# Patient Record
Sex: Male | Born: 1990 | Race: White | Hispanic: No | Marital: Single | Smoking: Former smoker
Health system: Southern US, Community
[De-identification: ages and names within clinical notes are randomized; demographics above are authoritative.]

## PROBLEM LIST (undated history)

## (undated) DIAGNOSIS — K219 Gastro-esophageal reflux disease without esophagitis: Secondary | ICD-10-CM

## (undated) DIAGNOSIS — J342 Deviated nasal septum: Secondary | ICD-10-CM

## (undated) DIAGNOSIS — K200 Eosinophilic esophagitis: Secondary | ICD-10-CM

## (undated) DIAGNOSIS — F419 Anxiety disorder, unspecified: Secondary | ICD-10-CM

## (undated) DIAGNOSIS — J302 Other seasonal allergic rhinitis: Secondary | ICD-10-CM

## (undated) HISTORY — DX: Other seasonal allergic rhinitis: J30.2

## (undated) HISTORY — DX: Anxiety disorder, unspecified: F41.9

## (undated) HISTORY — PX: TYMPANOSTOMY TUBE PLACEMENT: SHX32

---
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
## 2010-01-02 ENCOUNTER — Ambulatory Visit (HOSPITAL_COMMUNITY): Payer: Self-pay | Admitting: Psychiatry

## 2010-01-14 ENCOUNTER — Ambulatory Visit (HOSPITAL_COMMUNITY): Payer: Self-pay | Admitting: Marriage and Family Therapist

## 2010-01-23 ENCOUNTER — Ambulatory Visit (HOSPITAL_COMMUNITY): Payer: Self-pay | Admitting: Marriage and Family Therapist

## 2010-01-27 ENCOUNTER — Ambulatory Visit (HOSPITAL_COMMUNITY): Payer: Self-pay | Admitting: Psychiatry

## 2010-02-06 ENCOUNTER — Ambulatory Visit (HOSPITAL_COMMUNITY): Payer: Self-pay | Admitting: Marriage and Family Therapist

## 2010-02-11 ENCOUNTER — Ambulatory Visit (HOSPITAL_COMMUNITY): Payer: Self-pay | Admitting: Marriage and Family Therapist

## 2010-02-20 ENCOUNTER — Ambulatory Visit (HOSPITAL_COMMUNITY): Payer: Self-pay | Admitting: Marriage and Family Therapist

## 2010-02-25 ENCOUNTER — Ambulatory Visit (HOSPITAL_COMMUNITY): Payer: Self-pay | Admitting: Psychiatry

## 2010-03-06 ENCOUNTER — Ambulatory Visit (HOSPITAL_COMMUNITY): Payer: Self-pay | Admitting: Marriage and Family Therapist

## 2010-03-20 ENCOUNTER — Ambulatory Visit (HOSPITAL_COMMUNITY): Payer: Self-pay | Admitting: Marriage and Family Therapist

## 2010-04-09 ENCOUNTER — Ambulatory Visit (HOSPITAL_COMMUNITY): Payer: Self-pay | Admitting: Marriage and Family Therapist

## 2010-04-29 ENCOUNTER — Ambulatory Visit (HOSPITAL_COMMUNITY): Payer: Self-pay | Admitting: Marriage and Family Therapist

## 2010-05-20 ENCOUNTER — Ambulatory Visit (HOSPITAL_COMMUNITY): Payer: Self-pay | Admitting: Marriage and Family Therapist

## 2010-07-14 ENCOUNTER — Ambulatory Visit (HOSPITAL_COMMUNITY): Payer: Self-pay | Admitting: Marriage and Family Therapist

## 2010-08-27 ENCOUNTER — Ambulatory Visit (HOSPITAL_COMMUNITY): Payer: Self-pay | Admitting: Marriage and Family Therapist

## 2011-08-08 ENCOUNTER — Emergency Department (HOSPITAL_COMMUNITY)
Admission: EM | Admit: 2011-08-08 | Discharge: 2011-08-08 | Disposition: A | Discharge status: Home / Self Care | Payer: BC Managed Care – PPO | Attending: Emergency Medicine | Admitting: Emergency Medicine

## 2011-08-08 DIAGNOSIS — S61209A Unspecified open wound of unspecified finger without damage to nail, initial encounter: Secondary | ICD-10-CM | POA: Insufficient documentation

## 2011-08-08 DIAGNOSIS — W268XXA Contact with other sharp object(s), not elsewhere classified, initial encounter: Secondary | ICD-10-CM | POA: Insufficient documentation

## 2011-08-08 DIAGNOSIS — Y92009 Unspecified place in unspecified non-institutional (private) residence as the place of occurrence of the external cause: Secondary | ICD-10-CM | POA: Insufficient documentation

## 2011-12-07 ENCOUNTER — Ambulatory Visit (INDEPENDENT_AMBULATORY_CARE_PROVIDER_SITE_OTHER): Payer: BC Managed Care – PPO | Admitting: Psychiatry

## 2011-12-07 ENCOUNTER — Encounter (HOSPITAL_COMMUNITY): Payer: Self-pay | Admitting: Psychiatry

## 2011-12-07 DIAGNOSIS — F411 Generalized anxiety disorder: Secondary | ICD-10-CM

## 2011-12-07 MED ORDER — ARIPIPRAZOLE 5 MG PO TABS: ORAL_TABLET | ORAL | Status: DC

## 2011-12-07 NOTE — Patient Instructions (Signed)
Asperger Syndrome Asperger syndrome (AS) is one of the autistic spectrum disorders. Children with AS have good language skills and want to interact socially but are not very good at it. Like autistic children, children with AS have restricted and repetitive behavior.  CAUSES  There are many different ways to get AS, including:  Lack of oxygen.   Head injury from trauma.   Hormonal imbalances, such as low thyroid function.   Toxins, such as lead.   Genetic and biochemical disorders of the body.  SYMPTOMS  The most common symptoms of AS are:  Poor social skills. Children with AS do not understand what is and what is not appropriate when talking to other children.   Lack of understanding of personal space.   Lack of understanding of other people's point of view.   Obsessive interest in one topic or object (restricted behavior) and desire to know everything about their area of interest.   Constant discussion of one subject.   Repetitive behavior.   Hand flapping.   Rocking back and forth.   More complex motor movements.  Other symptoms may include:  Self abusive behavior, such as head banging or skin scratching.   Reduced pain sensitivity.   Over sensitivity to sensations, such as sound or touch.   Dislike of being touched or hugged.  DIAGNOSIS  The diagnosis of AS is based on clinical symptoms. Formal testing may be done to confirm the diagnosis. Once AS is diagnosed, a caregiver may order the following tests to find out the cause:  Thyroid studies (if not done at birth).   Lead level tests.   Genetic and biochemical tests.   A test that produces a record of brainwaves (electroencephalogram, EEG) may be done if seizures have occurred.   Neuroimaging studies may be done if there is injury to the brain or a loss of developmental skills.   Ophthalmologic evaluation regarding biochemical or genetic disorders.  TREATMENT  Because there are so many different causes of  AS, there is no single treatment that will work for every child. Treatment varies but is usually a combination of:   Social skills training. This teaches children to interact better with others, especially other children. Parents can set an example of good behavior for their children and teach them how to recognize social cues.   Behavioral therapy. This is for children with behavioral or emotional problems. This therapy can also help with obsessive interests or repetitive routines.   Medications. Medications may be used to treat attention deficit hyperactivity disorder, mood swings, obsessive-compulsive behavior, and oppositional defiant disorder.   Physical therapy helps with poor coordination of the large muscles. Parents can also help by enrolling their children in physical activities such as gymnastics or martial arts in which the child can progress at their own pace without the peer pressures found in team sports.   Occupational therapy helps with poor coordination of smaller muscles, such as muscles in the hand. Occupational therapy can also help with sensorimotor dyspraxias in which certain sounds or textures may be bothersome to the child.   Speech therapy helps children who have trouble with their speech or conversations.   Parent training and support teaches parents how to manage behavioral issues. Older children and teenagers may become sad when they realize they are different because of their AS. Parents should be prepared to empathize with their children when this occurs. Support groups can be helpful.  With continued and effective treatment, most children with AS have fewer   symptoms, and the children and families learn to cope. Although many children with AS go on to be successful and happy adults, social and personal relationships can continue to be challenging. SEEK MEDICAL CARE IF:   Your child has new behaviors that are worrying you.   You think your child is having reactions  to medicines.   Your older child is depressed. Symptoms may include unusual sadness, decreased appetite, weight loss, lack of interest in things that are normally enjoyed, change in sleep patterns.   Your older child shows signs of anxiety. Symptoms may include excessive worry, restlessness, irritability, trembling, difficultly sleeping.  SEEK IMMEDIATE MEDICAL CARE IF:   Your child talks about suicide or death.   Your child gives away his or her favorite possessions (a common sign that one is thinking about suicide).   Your child is suddenly cheerful or happy after having been sad for a long time (a sign that a decision has been made to attempt suicide).  Document Released: 08/08/2002 Document Revised: 07/29/2011 Document Reviewed: 03/20/2010 [employer] Patient Information 2012 [employer]. 

## 2011-12-07 NOTE — Progress Notes (Signed)
Psychiatric Assessment Adult  Patient Identification:  Terry Moreno Date of Evaluation:  12/07/2011 Chief Complaint: I am having intrusive thoughts, I have had a lot of anxiety History of Chief Complaint:   Chief Complaint  Patient presents with  . Depression  . Panic Attack  . Establish Care    HPI patient is a 21 year old male with a history of anxiety disorder who returns back to the practice secondary to complaints of increased anxiety, some depressive symptoms along with intrusive thoughts which are violent in nature. Patient adds that he is no history of violence, knows he will not act on those thoughts but is still worried about having them.  On being asked to elaborate the patient reports that he's had these thoughts for the past 2-3 weeks and adds that when he needs someone he is thoughts of hurting them, has violent sexual thoughts and that these thoughts frighten him. He denies having access to any firearms, having thoughts of hurting anyone specifically. He says that he is a lot of social support which include his parents, friends and adds that he is doing well at college. He currently denies any stressors which might have precipitated these thoughts. He adds that he's not in a relationship. He however is no longer struggling socially as he was 1-1/2 years ago. He currently lives in an apartment with roommates in [place] and does not have any thoughts of hurting his roommates.  The patient says that because of having such thoughts, he's unable to sleep as he scared that he might act on these at some point. He adds that it makes him depressed as he does not want to have these thoughts. He denies any symptoms of mania. He also denies any psychotic symptoms Review of SystemsNegative Physical Exam  Depressive Symptoms: insomnia, anxiety and decreased appetite  (Hypo) Manic Symptoms:   Elevated Mood:  No Irritable Mood:  No Grandiosity:  No Distractibility:  No Labiality of Mood:   No Delusions:  No Hallucinations:  No Impulsivity:  No Sexually Inappropriate Behavior:  No Financial Extravagance:  No Flight of Ideas:  No  Anxiety Symptoms: Excessive Worry:  Yes Panic Symptoms:  No Agoraphobia:  No Obsessive Compulsive: Yes  Symptoms: checking locks, checking if work turned in Specific Phobias:  No Social Anxiety:  No  Psychotic Symptoms:  Hallucinations: No None Delusions:  No Paranoia:  No   Ideas of Reference:  No  PTSD Symptoms: Ever had a traumatic exposure:  No Had a traumatic exposure in the last month:  No Re-experiencing: No None Hypervigilance:  No Hyperarousal: No None Avoidance: No None  Traumatic Brain Injury: No   Past Psychiatric History: Diagnosis: Anxiety disorder   Hospitalizations: None   Outpatient Care: With seen at outpatient [facility] for medication management and counseling   Substance Abuse Care: None   Self-Mutilation: None   Suicidal Attempts: None   Violent Behaviors: None    Past Medical History:   Past Medical History  Diagnosis Date  . Anxiety   . Seasonal allergies    History of Loss of Consciousness:  No Seizure History:  No Cardiac History:  No Allergies:  No Known Allergies Current Medications:  No current outpatient prescriptions on file.    Previous Psychotropic Medications:  Medication Dose  Celexa    Effexor                   Substance Abuse History in the last 12 months: Substance Age of 1st Use Last  Use Amount Specific Type        Alcohol  15  a week ago  once a mth  4-5 beers, couple of shots  Cannabis  19 Last week  twice a wk                                                                              Medical Consequences of Substance Abuse: none  Legal Consequences of Substance Abuse: none  Family Consequences of Substance Abuse: none  Blackouts:  No DT's:  No Withdrawal Symptoms:  No None  Social History: Current Place of Residence: Living in an off campus  apartment for the past 1 1/2 years Place of Birth: [place] Family Members: parents & 22 yr old sister Marital Status:  Single Children: none   Relationships: none  Education:  Automotive engineer, 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 Anxiety disorder Mother   . Anxiety disorder Father   . Depression Father     Mental Status Examination/Evaluation: Objective:  Appearance: Fairly Groomed  Patent attorney::  Fair  Speech:  Clear and Coherent  Volume:  Normal  Mood:  Anxious   Affect:  Congruent  Thought Process:  Goal Directed  Orientation:  Full  Thought Content:  Hallucinations: None  Suicidal Thoughts:  No  Homicidal Thoughts:  No  Judgement:  Intact  Insight:  Fair  Psychomotor Activity:  Normal  Akathisia:  No  Handed:  Right  AIMS (if indicated):  N/A  Assets:  Desire for Improvement Housing Physical Health Social Support           Assessment:  Axis I: Generalized Anxiety Disorder  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 Anxiety   . Seasonal allergies      AXIS IV problems related to social environment  AXIS V 60   Treatment Plan/Recommendations:  Plan of Care: To start Abilify 5 mg half a pill in the evening for 2 days and then increase to one pill for 2 weeks and then 2 pills Qdaily  Laboratory:  none  Psychotherapy: Pt not willing for therapy    Routine PRN Medications:  No  Consultations: None  Safety Concerns:  Discussed going to the ER if the thoughts were so intrusive that the patient felt that he would act on them. Patient also stated that he talks to his mother regularly and that she supportive and is  available  for him  Other:  Followup in 3-4 weeks Call when necessary     Nelly Rout, MD 1/7/20139:44 AM

## 2011-12-09 ENCOUNTER — Encounter (HOSPITAL_COMMUNITY): Payer: Self-pay | Admitting: *Deleted

## 2011-12-16 ENCOUNTER — Other Ambulatory Visit (HOSPITAL_COMMUNITY): Payer: Self-pay | Admitting: Physician Assistant

## 2011-12-16 DIAGNOSIS — F411 Generalized anxiety disorder: Secondary | ICD-10-CM

## 2011-12-16 MED ORDER — RISPERIDONE 0.5 MG PO TABS: ORAL_TABLET | ORAL | Status: DC

## 2011-12-22 ENCOUNTER — Telehealth (HOSPITAL_COMMUNITY): Payer: Self-pay | Admitting: *Deleted

## 2011-12-22 NOTE — Telephone Encounter (Signed)
1607: Dr. Lucianne Muss requested patient be offered earlier appt of 12/24/11 Since he does not want to take any medications at this time. Left message for patient at [phone]. Contacted pt's mother per Dr. Remus Blake request at [phone] to express concern about Zymarion and to let her know that appt was offered to pt for 12/24/11.

## 2011-12-22 NOTE — Telephone Encounter (Signed)
1430: Contacted patient find out if Risperdal started last week had helped. He said he took only a few doses, and it "made it hard to breathe".  He says he is not sleeping and describes "feeling like a ghost". Patient states he did not want to take anymore antipsychotics ever. He reports that he does not like they way they make him feel. He sates he feels "ok" when he does not take them. He asks about taking Xanax as he says that others in his family have taken it with success. Explained to patient that Dr. Lucianne Muss  Prefers not to prescribe benzodiazepines when other medications will work. Asked patient if Dr. Lucianne Muss could contact his mother, expressing concern for his well being. He states he does not want his mother called, he will keep her updated. Informs this Clinical research associate that he is going to see a doctor this afternoon for something for his insomnia, which is worse. Advised patient to contact office PRN, and reminded him of his appointment on 12/29/11 at 9am. 1445: Informed Dr. Lucianne Muss of above conversation. She ordered Vistaril 25mg  po TID anxiety for patient. She asks that mother be contacted and be asked to keep contact and offer support with patient during this time. Dr. Lucianne Muss also asks that mother come to patient's next appointment. Contacted mother at [phone] and left message with above information from Dr. Lucianne Muss. Contacted patient to tell him of order for Vistaril. He asked what class the medication was. Writer informed him it was not an antipsychotic nor a benzodiazepine, but it would help his anxiety. The patient stated initially that he wanted to think about taking it, but then stated that he would not take it. Offered to call Rx in to pharmacy, and then he could make up his mind, but he said he would not take it. Advised patient to contact office PRN for questions.

## 2011-12-29 ENCOUNTER — Encounter (HOSPITAL_COMMUNITY): Payer: Self-pay | Admitting: Psychiatry

## 2011-12-29 ENCOUNTER — Ambulatory Visit (INDEPENDENT_AMBULATORY_CARE_PROVIDER_SITE_OTHER): Payer: BC Managed Care – PPO | Admitting: Psychiatry

## 2011-12-29 DIAGNOSIS — F410 Panic disorder [episodic paroxysmal anxiety] without agoraphobia: Secondary | ICD-10-CM

## 2011-12-29 MED ORDER — HYDROXYZINE PAMOATE 25 MG PO CAPS: 25.0000 mg | ORAL_CAPSULE | Freq: Three times a day (TID) | ORAL | Status: AC | PRN

## 2011-12-29 NOTE — Progress Notes (Signed)
Patient ID: Terry Moreno, male   DOB: Aug 27, 1991, 21 y.o.   MRN: [id number]  Psychiatric Assessment Adult  Patient Identification:  Terry Moreno Date of Evaluation:  12/29/2011 Chief Complaint: I am having intrusive thoughts, I have had a lot of anxiety History of Chief Complaint:  Patient is a 21 year old male with a history of anxiety disorder who was seen on 12/07/11 secondary to complaints of increased anxiety, some depressive symptoms along with intrusive thoughts which are violent in nature. Patient has no history of violence. Patient says that he is still occasionally having intrusive thoughts but they're less now, he felt that the antipsychotics make the thoughts worse and now that he's not taking any medication he feels better. He says that he sleeps well, his anxiety is less and so he is focusing on his sleep habits. He does acknowledge that he gets anxious at times and is willing to try Vistaril out on a when necessary basis and does not want to try any other medications. He adds that his mom is really supportive and therefore him on that they have been open communication. He denies having any thoughts of harming himself or others . He however still get socially anxious at times     Chief Complaint  Patient presents with  . Anxiety  . Depression  . Follow-up    Anxiety      Review of SystemsNegative Physical Exam  Depressive Symptoms: insomnia, anxiety   (Hypo) Manic Symptoms:   Elevated Mood:  No Irritable Mood:  No Grandiosity:  No Distractibility:  No Labiality of Mood:  No Delusions:  No Hallucinations:  No Impulsivity:  No Sexually Inappropriate Behavior:  No Financial Extravagance:  No Flight of Ideas:  No  Anxiety Symptoms: Excessive Worry:  Yes Panic Symptoms:  No Agoraphobia:  No Obsessive Compulsive: Yes  Symptoms: checking locks, checking if work turned in Specific Phobias:  No Social Anxiety:  No  Psychotic Symptoms:  Hallucinations: No  None Delusions:  No Paranoia:  No   Ideas of Reference:  No  Past Medical History:   Past Medical History  Diagnosis Date  . Anxiety   . Seasonal allergies    History of Loss of Consciousness:  No Seizure History:  No Cardiac History:  No Allergies:  No Known Allergies Current Medications:  Current Outpatient Prescriptions  Medication Sig Dispense Refill  . fluticasone (FLONASE) 50 MCG/ACT nasal spray       . hydrOXYzine (VISTARIL) 25 MG capsule Take 1 capsule (25 mg total) by mouth 3 (three) times daily as needed for anxiety.  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 Anxiety disorder Mother   . Anxiety disorder Father   . Depression Father     Mental Status Examination/Evaluation: Objective:  Appearance: Fairly Groomed  Patent attorney::  Fair  Speech:  Clear and Coherent  Volume:  Normal  Mood:  Anxious   Affect:  Congruent  Thought Process:  Goal Directed  Orientation:  Full  Thought Content:  Hallucinations: None  Suicidal Thoughts:  No  Homicidal Thoughts:  No  Judgement:  Intact  Insight:  Fair  Psychomotor Activity:  Normal  Akathisia:  No  Handed:  Right  AIMS (if indicated):  N/A  Assets:  Desire for Improvement Housing Physical Health Social Support           Assessment:  Axis I: Generalized Anxiety Disorder  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 Anxiety   . Seasonal allergies      AXIS IV problems related to social environment  AXIS V 60   Treatment Plan/Recommendations:  Patient unwilling to take an antipsychotic and so it was  discontinued at this visit. To start Vistaril 25 mg by mouth  3 times a day when necessary anxiety  Call when necessary Discuss again safety plan with patient which includes talking to mom, coming to the ER if overwhelmed and calling the office so that patient can be seen urgently. Patient feels that he is doing fairly well and that if he gets asleep back to baseline he should do well. Followup in 4 weeks  Nelly Rout, MD 1/29/201310:30 AM

## 2012-02-02 ENCOUNTER — Ambulatory Visit (HOSPITAL_COMMUNITY): Payer: BC Managed Care – PPO | Admitting: Psychiatry

## 2012-02-06 ENCOUNTER — Other Ambulatory Visit (HOSPITAL_COMMUNITY): Payer: Self-pay | Admitting: Physician Assistant

## 2012-07-14 ENCOUNTER — Emergency Department (HOSPITAL_COMMUNITY)
Admission: EM | Admit: 2012-07-14 | Discharge: 2012-07-14 | Disposition: A | Discharge status: Home / Self Care | Payer: BC Managed Care – PPO | Attending: Emergency Medicine | Admitting: Emergency Medicine

## 2012-07-14 ENCOUNTER — Encounter (HOSPITAL_COMMUNITY): Payer: Self-pay | Admitting: Emergency Medicine

## 2012-07-14 DIAGNOSIS — R109 Unspecified abdominal pain: Secondary | ICD-10-CM

## 2012-07-14 LAB — CBC WITH DIFFERENTIAL/PLATELET
Basophils Absolute: 0 10*3/uL (ref 0.0–0.1)
Basophils Relative: 1 % (ref 0–1)
Eosinophils Absolute: 0.2 10*3/uL (ref 0.0–0.7)
Eosinophils Relative: 3 % (ref 0–5)
HCT: 44 % (ref 39.0–52.0)
Hemoglobin: 15.8 g/dL (ref 13.0–17.0)
Lymphocytes Relative: 50 % — ABNORMAL HIGH (ref 12–46)
Lymphs Abs: 3.1 10*3/uL (ref 0.7–4.0)
MCH: 33.2 pg (ref 26.0–34.0)
MCHC: 35.9 g/dL (ref 30.0–36.0)
MCV: 92.4 fL (ref 78.0–100.0)
Monocytes Absolute: 0.6 10*3/uL (ref 0.1–1.0)
Monocytes Relative: 9 % (ref 3–12)
Neutro Abs: 2.4 10*3/uL (ref 1.7–7.7)
Neutrophils Relative %: 38 % — ABNORMAL LOW (ref 43–77)
Platelets: 241 10*3/uL (ref 150–400)
RBC: 4.76 MIL/uL (ref 4.22–5.81)
RDW: 12.4 % (ref 11.5–15.5)
WBC: 6.2 10*3/uL (ref 4.0–10.5)

## 2012-07-14 LAB — COMPREHENSIVE METABOLIC PANEL
ALT: 25 U/L (ref 0–53)
AST: 24 U/L (ref 0–37)
Albumin: 4.4 g/dL (ref 3.5–5.2)
Alkaline Phosphatase: 60 U/L (ref 39–117)
BUN: 14 mg/dL (ref 6–23)
CO2: 23 mEq/L (ref 19–32)
Calcium: 9.5 mg/dL (ref 8.4–10.5)
Chloride: 104 mEq/L (ref 96–112)
Creatinine, Ser: 0.93 mg/dL (ref 0.50–1.35)
GFR calc Af Amer: >90 mL/min (ref >90)
GFR calc non Af Amer: >90 mL/min (ref >90)
Glucose, Bld: 104 mg/dL — ABNORMAL HIGH (ref 70–99)
Potassium: 3.9 mEq/L (ref 3.5–5.1)
Sodium: 141 mEq/L (ref 135–145)
Total Bilirubin: 0.8 mg/dL (ref 0.3–1.2)
Total Protein: 7.2 g/dL (ref 6.0–8.3)

## 2012-07-14 MED ORDER — SODIUM CHLORIDE 0.9 % IV BOLUS (SEPSIS)
1000.0000 mL | Freq: Once | INTRAVENOUS | Status: AC
  Administered 2012-07-14: 1000 mL via INTRAVENOUS

## 2012-07-14 MED ORDER — ONDANSETRON HCL 4 MG/2ML IJ SOLN
4.0000 mg | Freq: Once | INTRAMUSCULAR | Status: AC
  Administered 2012-07-14: 4 mg via INTRAVENOUS
  Filled 2012-07-14: qty 2

## 2012-07-14 MED ORDER — ESOMEPRAZOLE MAGNESIUM 40 MG PO CPDR: 40.0000 mg | DELAYED_RELEASE_CAPSULE | Freq: Every day | ORAL | Status: DC

## 2012-07-14 NOTE — ED Notes (Signed)
Pt here with tar colored stools . Pt  Due for a scope next Tuesday. Pt is cold and pale in color

## 2012-07-14 NOTE — ED Notes (Signed)
Pt. States having stomach pains for the last year that has significantly increased in the last few weeks. He is scheduled for an endoscopy on Tuesday but states the pain was a 9/10 last night. Pt. States having tarry stools for the past 2 weeks and dry heaves for the past week. Pt.has had difficulty sleeping for the past few weeks. As well.

## 2012-07-14 NOTE — ED Provider Notes (Addendum)
History     CSN: [id number]  Arrival date & time 07/14/12  1039   First MD Initiated Contact with Patient 07/14/12 1042      Chief Complaint  Patient presents with  . Rectal Bleeding    (Consider location/radiation/quality/duration/timing/severity/associated sxs/prior treatment) Patient is a 21 y.o. male presenting with abdominal pain. The history is provided by the patient and a parent.  Abdominal Pain The primary symptoms of the illness include abdominal pain, nausea and vomiting. Primary symptoms comment: dark stools Episode onset: 2 weeks ago. The onset of the illness was gradual. The problem has been gradually worsening.  Onset: has been off and on for 1 year. The pain came on gradually. The abdominal pain has been unchanged since its onset. The abdominal pain is generalized (worse on the right side). The abdominal pain radiates to the back. The severity of the abdominal pain is 7/10. The abdominal pain is relieved by belching. The abdominal pain is exacerbated by eating.  Associated with: no EtOH but does take goody's powder. The patient has had a change in bowel habit (2 weeks of recent black stools). Additional symptoms associated with the illness include anorexia. Symptoms associated with the illness do not include urgency, frequency or back pain. Significant associated medical issues do not include diabetes, gallstones or liver disease.    Past Medical History  Diagnosis Date  . Anxiety   . Seasonal allergies     Past Surgical History  Procedure Date  . Tubes in ears     in the past    Family History  Problem Relation Age of Onset  . Anxiety disorder Mother   . Anxiety disorder Father   . Depression Father     History  Substance Use Topics  . Smoking status: Never Smoker   . Smokeless tobacco: Not on file  . Alcohol Use: 0.6 oz/week    1 Cans of beer per week      Review of Systems  Gastrointestinal: Positive for nausea, vomiting, abdominal pain and  anorexia.  Genitourinary: Negative for urgency and frequency.  Musculoskeletal: Negative for back pain.  All other systems reviewed and are negative.    Allergies  Review of patient's allergies indicates no known allergies.  Home Medications   Current Outpatient Rx  Name Route Sig Dispense Refill  . GOODYS EXTRA STRENGTH PO Oral Take 1 packet by mouth every 6 (six) hours as needed. pain    . CALCIUM CARBONATE ANTACID 500 MG PO CHEW Oral Chew 1 tablet by mouth 3 (three) times daily as needed. indigestion    . CETIRIZINE HCL 10 MG PO TABS Oral Take 10 mg by mouth daily.    Marland Kitchen ESOMEPRAZOLE MAGNESIUM 40 MG PO CPDR Oral Take 40 mg by mouth daily before breakfast.      BP 136/86  Pulse 97  Temp 98.5 F (36.9 C) (Oral)  Resp 18  SpO2 98%  Physical Exam  Nursing note and vitals reviewed. Constitutional: He is oriented to person, place, and time. He appears well-developed and well-nourished. No distress.  HENT:  Head: Normocephalic and atraumatic.  Mouth/Throat: Oropharynx is clear and moist.  Eyes: Conjunctivae and EOM are normal. Pupils are equal, round, and reactive to light.  Neck: Normal range of motion. Neck supple.  Cardiovascular: Normal rate, regular rhythm and intact distal pulses.   No murmur heard. Pulmonary/Chest: Effort normal and breath sounds normal. No respiratory distress. He has no wheezes. He has no rales.  Abdominal: Soft. He exhibits no  distension. There is tenderness in the right upper quadrant, periumbilical area and left lower quadrant. There is no rebound and no guarding.       Diffuse abd pain more localized in the RUQ and LLQ  Genitourinary: Rectum normal. Rectal exam shows no external hemorrhoid. Guaiac negative stool.  Musculoskeletal: Normal range of motion. He exhibits no edema and no tenderness.  Neurological: He is alert and oriented to person, place, and time.  Skin: Skin is warm and dry. No rash noted. No erythema. No pallor.  Psychiatric: He has  a normal mood and affect. His behavior is normal.    ED Course  Procedures (including critical care time)  Labs Reviewed  CBC WITH DIFFERENTIAL - Abnormal; Notable for the following:    Neutrophils Relative 38 (*)     Lymphocytes Relative 50 (*)     All other components within normal limits  COMPREHENSIVE METABOLIC PANEL - Abnormal; Notable for the following:    Glucose, Bld 104 (*)     All other components within normal limits  OCCULT BLOOD, POC DEVICE   No results found.   1. Abdominal  pain, other specified site       MDM   Patient presents stating he's had tarry dark stools for the last 2 weeks and feeling very weak and near syncopal. Patient states he has not slept in the last 2 weeks he he continually wakes up and is unclear why. Also states that his abdomen is been hurting worse despite taking Nexium. He is seen by GI and scheduled to have an endoscopy done on Tuesday however given his new symptoms they spoke with the on-call doctor last night and were unable to get a sooner appointment today. On exam patient has normal cap refill and normal color conjunctiva. He is heme-negative from below.  Will give IV fluids and Zofran due to him complaining of persistent nausea. Patient does use Goody's powders for pain control and is also currently taking Nexium.   CBC, CMP pending. Hemoccult negative  12:14 PM Pt has normal labs.  This was discussed with pt and mother and will have f/u with GI as planned.     Gwyneth Sprout, MD 07/14/12 1215  Gwyneth Sprout, MD 07/14/12 [phone]

## 2012-07-16 ENCOUNTER — Emergency Department (HOSPITAL_COMMUNITY)
Admission: EM | Admit: 2012-07-16 | Discharge: 2012-07-16 | Disposition: A | Discharge status: Home / Self Care | Payer: BC Managed Care – PPO | Attending: Emergency Medicine | Admitting: Emergency Medicine

## 2012-07-16 ENCOUNTER — Encounter (HOSPITAL_COMMUNITY): Payer: Self-pay | Admitting: *Deleted

## 2012-07-16 ENCOUNTER — Emergency Department (HOSPITAL_COMMUNITY): Payer: BC Managed Care – PPO

## 2012-07-16 DIAGNOSIS — M549 Dorsalgia, unspecified: Secondary | ICD-10-CM | POA: Insufficient documentation

## 2012-07-16 DIAGNOSIS — F411 Generalized anxiety disorder: Secondary | ICD-10-CM | POA: Insufficient documentation

## 2012-07-16 DIAGNOSIS — R109 Unspecified abdominal pain: Secondary | ICD-10-CM | POA: Insufficient documentation

## 2012-07-16 DIAGNOSIS — K219 Gastro-esophageal reflux disease without esophagitis: Secondary | ICD-10-CM | POA: Insufficient documentation

## 2012-07-16 DIAGNOSIS — Z9109 Other allergy status, other than to drugs and biological substances: Secondary | ICD-10-CM | POA: Insufficient documentation

## 2012-07-16 HISTORY — DX: Gastro-esophageal reflux disease without esophagitis: K21.9

## 2012-07-16 LAB — URINALYSIS, ROUTINE W REFLEX MICROSCOPIC
Glucose, UA: NEGATIVE mg/dL
Hgb urine dipstick: NEGATIVE
Ketones, ur: NEGATIVE mg/dL
Protein, ur: NEGATIVE mg/dL
Urobilinogen, UA: 1 mg/dL (ref 0.0–1.0)

## 2012-07-16 MED ORDER — OXYCODONE-ACETAMINOPHEN 5-325 MG PO TABS: 1.0000 | ORAL_TABLET | ORAL | Status: AC | PRN

## 2012-07-16 MED ORDER — GI COCKTAIL ~~LOC~~
30.0000 mL | Freq: Once | ORAL | Status: AC
  Administered 2012-07-16: 30 mL via ORAL
  Filled 2012-07-16: qty 30

## 2012-07-16 MED ORDER — IOHEXOL 300 MG/ML  SOLN
20.0000 mL | INTRAMUSCULAR | Status: AC
  Administered 2012-07-16: 20 mL via ORAL

## 2012-07-16 MED ORDER — SODIUM CHLORIDE 0.9 % IV SOLN
Freq: Once | INTRAVENOUS | Status: AC
  Administered 2012-07-16: 21:00:00 via INTRAVENOUS

## 2012-07-16 MED ORDER — SUCRALFATE 1 G PO TABS: 1.0000 g | ORAL_TABLET | Freq: Four times a day (QID) | ORAL | Status: DC

## 2012-07-16 MED ORDER — IOHEXOL 300 MG/ML  SOLN
100.0000 mL | Freq: Once | INTRAMUSCULAR | Status: AC | PRN
  Administered 2012-07-16: 100 mL via INTRAVENOUS

## 2012-07-16 NOTE — ED Notes (Signed)
Pt reports having right side abd pain and back pain, has been occuring for months but has become more severe over the past few days. Having nausea, denies vomiting or diarrhea.

## 2012-07-16 NOTE — ED Notes (Signed)
MD at bedside. 

## 2012-07-16 NOTE — ED Provider Notes (Signed)
History     CSN: [id number]  Arrival date & time 07/16/12  6213   First MD Initiated Contact with Patient 07/16/12 1851      Chief Complaint  Patient presents with  . Abdominal Pain   HPI:  This is a 21 year old male presenting with abdominal pain.  He has been experiencing this pain for over a year.  At first, it would be present constantly for a month, and then resolve for a month or two.  It was a mild, nagging pain then.  A couple of months ago (late June), this mild, nagging pain camp back and was like the previous episodes, but then two weeks ago, starting becoming more severe and has worsened since then to now a severe, debilitating pain.  He described it as though a fist was in his side moving around.  The pain is located in the right lower back and extends around the flank and into the RLQ. The pain sometimes radiates into his left arm and left leg.  It is not associated with bowel movements, meals, motion, or breathing.  NSAIDs seem to make the pain worse.  Marijuana is the only thing that makes it better.  He has smoked for over a year and was smoking before these pains started.  He now smokes once a day every day.  He endorses a frontal headache, nasal congestion, and nausea.  He has also been having hard to pass stool that requires considerable straining to pass.  He denies vomiting, diarrhea, dysuria, hematochezia, and hematuria.  He was seen here a few days ago with black, tarry stools.  This has since cleared and FOBT then was negative.  He has an appt with GI in 3 days.  Today, in addition to the above primary complaint, he has begun experiencing dizziness described as lightheadedness when standing.  Past Medical History  Diagnosis Date  . Anxiety   . Seasonal allergies   . Acid reflux     Past Surgical History  Procedure Date  . Tympanostomy tube placement     in the past    Family History  Problem Relation Age of Onset  . Anxiety disorder Mother   . Anxiety disorder  Father   . Depression Father     History  Substance Use Topics  . Smoking status: Never Smoker   . Smokeless tobacco: Never Used  . Alcohol Use: No     Has stopped because of current complaint.    Review of Systems  All other systems reviewed and are negative.     Allergies  Review of patient's allergies indicates no known allergies.  Home Medications   Current Outpatient Rx  Name Route Sig Dispense Refill  . ACETAMINOPHEN 500 MG PO TABS Oral Take 1,000 mg by mouth every 6 (six) hours as needed. For pain    . CETIRIZINE HCL 10 MG PO TABS Oral Take 10 mg by mouth daily.    Marland Kitchen DIPHENHYDRAMINE HCL 25 MG PO CAPS Oral Take 25 mg by mouth at bedtime as needed. For sleep    . ESOMEPRAZOLE MAGNESIUM 40 MG PO CPDR Oral Take 40 mg by mouth daily before breakfast.    . MELATONIN 10 MG PO CAPS Oral Take 1 capsule by mouth at bedtime.      BP 122/65  Pulse 90  Temp 98 F (36.7 C) (Oral)  Resp 18  SpO2 100%  Physical Exam  Constitutional: He is oriented to person, place, and time. He  appears well-developed and well-nourished. He does not appear ill. No distress.  HENT:  Head: Normocephalic and atraumatic.  Right Ear: Hearing, tympanic membrane, external ear and ear canal normal.  Left Ear: Hearing, tympanic membrane, external ear and ear canal normal.  Nose: Mucosal edema (and erythema) present.  Mouth/Throat: Uvula is midline, oropharynx is clear and moist and mucous membranes are normal.  Eyes: Conjunctivae and EOM are normal. Pupils are equal, round, and reactive to light.       Funduscopic examination was normal bilaterally.  Neck: Normal range of motion and full passive range of motion without pain. Neck supple. No mass and no thyromegaly present.  Cardiovascular: Normal rate, regular rhythm, normal heart sounds and normal pulses.   No murmur heard. Pulmonary/Chest: Effort normal and breath sounds normal. He exhibits no tenderness.  Abdominal: Soft. Normal appearance and  bowel sounds are normal. He exhibits no mass. There is no hepatosplenomegaly. There is tenderness in the right lower quadrant. There is no rigidity, no rebound, no guarding, no CVA tenderness and negative Murphy's sign.  Musculoskeletal: Normal range of motion.  Lymphadenopathy:    He has no cervical adenopathy.  Neurological: He is alert and oriented to person, place, and time. He has normal strength. No cranial nerve deficit (specifically 2 through 12 tested) or sensory deficit. He displays a negative Romberg sign. Gait normal. GCS eye subscore is 4. GCS verbal subscore is 5. GCS motor subscore is 6.  Skin: Skin is warm, dry and intact.    ED Course  Procedures (including critical care time)  Labs Reviewed  URINALYSIS, ROUTINE W REFLEX MICROSCOPIC - Abnormal; Notable for the following:    APPearance HAZY (*)     All other components within normal limits   Ct Abdomen Pelvis W Contrast  07/16/2012  *RADIOLOGY REPORT*  Clinical Data: Right-sided abdominal pain and back pain for months. More severe over the past few days.  Nausea.  CT ABDOMEN AND PELVIS WITH CONTRAST  Technique:  Multidetector CT imaging of the abdomen and pelvis was performed following the standard protocol during bolus administration of intravenous contrast.  Contrast: OMNIPAQUE IOHEXOL 300 MG/ML  SOLN  Comparison: None.  Findings: The lung bases are clear.  The liver, spleen, gallbladder, pancreas, adrenal glands, kidneys, abdominal aorta, and retroperitoneal lymph nodes are unremarkable. The stomach, small bowel, and colon are not abnormally distended. No free air or free fluid in the abdomen.  Pelvis:  The appendix is normal.  Prostate gland is not enlarged. No bladder wall thickening.  No free or loculated pelvic fluid collections.  Stool filled rectosigmoid colon without evidence of diverticulitis.  No significant pelvic lymphadenopathy.  Normal alignment of the lumbar vertebrae.  IMPRESSION: No acute process demonstrated  in the abdomen or pelvis.  Original Report Authenticated By: Marlon Pel, M.D.     1. Abdominal pain       MDM  1.   Abdominal pain:  I think the most likely diagnosis is irritable bowel syndrome.  Parts of the history, such as pain after eating and partial response to antacids raises the suspicion of gastritis or ulcers, the location of the pain is not characteristic.  Laboratory studies have all been normal and CT of abdomen/pelvis was normal.  We will discharge with Carafate and Percocet.  He will continue taking esomeprazole and see [facility] in three days.  Lollie Sails, MD 07/16/12 2217

## 2012-07-16 NOTE — ED Notes (Signed)
Pt tolerated IV insertion well.  Pt started on PO Contrast.

## 2012-07-16 NOTE — ED Provider Notes (Signed)
I saw and evaluated the patient, reviewed the resident's note and I agree with the findings and plan.   .Face to face Exam:  General:  Awake HEENT:  Atraumatic Resp:  Normal effort Abd:  Nondistended Neuro:No focal weakness Lymph: No adenopathy   Nelia Shi, MD 07/16/12 2221

## 2012-07-16 NOTE — ED Notes (Signed)
Discharge instructions reviewed.  Pt verbalized understanding.  Pt condition stable, no respiratory compromise noted.  Skin warm and dry.  Alert and oriented x 4.  No signs of distress.  

## 2012-09-13 ENCOUNTER — Other Ambulatory Visit: Payer: Self-pay | Admitting: Family Medicine

## 2012-09-13 DIAGNOSIS — M542 Cervicalgia: Secondary | ICD-10-CM

## 2012-09-17 ENCOUNTER — Ambulatory Visit
Admission: RE | Admit: 2012-09-17 | Discharge: 2012-09-17 | Disposition: A | Discharge status: Home / Self Care | Payer: BC Managed Care – PPO | Source: Ambulatory Visit | Attending: Family Medicine | Admitting: Family Medicine

## 2012-09-17 DIAGNOSIS — M542 Cervicalgia: Secondary | ICD-10-CM

## 2012-10-03 ENCOUNTER — Other Ambulatory Visit (HOSPITAL_COMMUNITY): Payer: Self-pay | Admitting: Family Medicine

## 2012-10-03 DIAGNOSIS — R1011 Right upper quadrant pain: Secondary | ICD-10-CM

## 2012-10-10 ENCOUNTER — Encounter (HOSPITAL_COMMUNITY)
Admission: RE | Admit: 2012-10-10 | Discharge: 2012-10-10 | Disposition: A | Discharge status: Home / Self Care | Payer: BC Managed Care – PPO | Source: Ambulatory Visit | Attending: Family Medicine | Admitting: Family Medicine

## 2012-10-10 DIAGNOSIS — R141 Gas pain: Secondary | ICD-10-CM | POA: Insufficient documentation

## 2012-10-10 DIAGNOSIS — R110 Nausea: Secondary | ICD-10-CM | POA: Insufficient documentation

## 2012-10-10 DIAGNOSIS — R1011 Right upper quadrant pain: Secondary | ICD-10-CM

## 2012-10-10 DIAGNOSIS — R142 Eructation: Secondary | ICD-10-CM | POA: Insufficient documentation

## 2012-10-10 MED ORDER — TECHNETIUM TC 99M MEBROFENIN IV KIT
5.4000 | PACK | Freq: Once | INTRAVENOUS | Status: AC | PRN
  Administered 2012-10-10: 5 via INTRAVENOUS

## 2012-10-14 ENCOUNTER — Encounter (INDEPENDENT_AMBULATORY_CARE_PROVIDER_SITE_OTHER): Payer: Self-pay

## 2012-10-17 ENCOUNTER — Other Ambulatory Visit (HOSPITAL_COMMUNITY): Payer: BC Managed Care – PPO

## 2012-10-19 ENCOUNTER — Encounter (HOSPITAL_COMMUNITY)
Admission: RE | Admit: 2012-10-19 | Discharge: 2012-10-19 | Disposition: A | Discharge status: Home / Self Care | Payer: BC Managed Care – PPO | Source: Ambulatory Visit | Attending: Family Medicine | Admitting: Family Medicine

## 2012-10-19 DIAGNOSIS — R1011 Right upper quadrant pain: Secondary | ICD-10-CM

## 2012-10-19 MED ORDER — SODIUM PERTECHNETATE TC 99M INJECTION
10.5000 | Freq: Once | INTRAVENOUS | Status: AC | PRN
  Administered 2012-10-19: 11 via INTRAVENOUS

## 2012-10-20 ENCOUNTER — Encounter (INDEPENDENT_AMBULATORY_CARE_PROVIDER_SITE_OTHER): Payer: Self-pay | Admitting: General Surgery

## 2012-10-21 ENCOUNTER — Emergency Department (HOSPITAL_COMMUNITY)
Admission: EM | Admit: 2012-10-21 | Discharge: 2012-10-22 | Disposition: A | Discharge status: Home / Self Care | Payer: BC Managed Care – PPO | Attending: Emergency Medicine | Admitting: Emergency Medicine

## 2012-10-21 ENCOUNTER — Ambulatory Visit (INDEPENDENT_AMBULATORY_CARE_PROVIDER_SITE_OTHER): Payer: BC Managed Care – PPO | Admitting: General Surgery

## 2012-10-21 ENCOUNTER — Encounter (INDEPENDENT_AMBULATORY_CARE_PROVIDER_SITE_OTHER): Payer: Self-pay | Admitting: General Surgery

## 2012-10-21 ENCOUNTER — Encounter (HOSPITAL_COMMUNITY): Payer: Self-pay | Admitting: Emergency Medicine

## 2012-10-21 VITALS — BP 106/70 | HR 74 | Temp 98.6°F | Ht 73.0 in | Wt 145.6 lb

## 2012-10-21 DIAGNOSIS — G8929 Other chronic pain: Secondary | ICD-10-CM

## 2012-10-21 DIAGNOSIS — R1011 Right upper quadrant pain: Secondary | ICD-10-CM

## 2012-10-21 DIAGNOSIS — F17200 Nicotine dependence, unspecified, uncomplicated: Secondary | ICD-10-CM | POA: Insufficient documentation

## 2012-10-21 DIAGNOSIS — Z8659 Personal history of other mental and behavioral disorders: Secondary | ICD-10-CM | POA: Insufficient documentation

## 2012-10-21 DIAGNOSIS — Z8719 Personal history of other diseases of the digestive system: Secondary | ICD-10-CM | POA: Insufficient documentation

## 2012-10-21 MED ORDER — SODIUM CHLORIDE 0.9 % IV BOLUS (SEPSIS)
1000.0000 mL | Freq: Once | INTRAVENOUS | Status: AC
  Administered 2012-10-21: 1000 mL via INTRAVENOUS

## 2012-10-21 MED ORDER — ONDANSETRON HCL 4 MG/2ML IJ SOLN
4.0000 mg | Freq: Once | INTRAMUSCULAR | Status: AC
  Administered 2012-10-21: 4 mg via INTRAVENOUS
  Filled 2012-10-21: qty 2

## 2012-10-21 MED ORDER — MORPHINE SULFATE 4 MG/ML IJ SOLN
4.0000 mg | Freq: Once | INTRAMUSCULAR | Status: AC
  Administered 2012-10-21: 4 mg via INTRAVENOUS
  Filled 2012-10-21: qty 1

## 2012-10-21 NOTE — ED Provider Notes (Signed)
History     CSN: [id number]  Arrival date & time 10/21/12  4098   First MD Initiated Contact with Patient 10/21/12 2134      Chief Complaint  Patient presents with  . Abdominal Pain    (Consider location/radiation/quality/duration/timing/severity/associated sxs/prior treatment) HPI Comments: Patient is a 21 year old male who presents with a 1 year history of RUQ abdominal pain. The pain is located in the RUQ pain and does not radiate. The pain is described as stabbing and aching and severe. The pain started gradually and progressively worsened since the onset. The pain is constant and made worse with eating. No alleviating factors. The patient has tried various pain medication for symptoms without relief. Associated symptoms include belching. Patient denies fever, headache, NVD, chest pain, SOB, dysuria, constipation.      Past Medical History  Diagnosis Date  . Anxiety   . Seasonal allergies   . Acid reflux     Past Surgical History  Procedure Date  . Tympanostomy tube placement     in the past  . Wisdom tooth extraction 2008    Family History  Problem Relation Age of Onset  . Anxiety disorder Mother   . Anxiety disorder Father   . Depression Father   . Cancer Maternal Grandfather     lukemia  . Cancer Paternal Grandfather     pancreatic    History  Substance Use Topics  . Smoking status: Current Every Day Smoker  . Smokeless tobacco: Never Used  . Alcohol Use: 0.6 oz/week    1 Cans of beer per week     Comment: Has stopped because of current complaint.      Review of Systems  Gastrointestinal: Positive for abdominal pain.  All other systems reviewed and are negative.    Allergies  Review of patient's allergies indicates no known allergies.  Home Medications   Current Outpatient Rx  Name  Route  Sig  Dispense  Refill  . CETIRIZINE HCL 10 MG PO TABS   Oral   Take 10 mg by mouth daily.         Marland Kitchen MELATONIN 10 MG PO CAPS   Oral   Take 1  capsule by mouth at bedtime.           BP 102/68  Pulse 77  Temp 98 F (36.7 C) (Oral)  Resp 16  Ht 6\' 1"  (1.854 m)  Wt 143 lb (64.864 kg)  BMI 18.87 kg/m2  SpO2 98%  Physical Exam  Nursing note and vitals reviewed. Constitutional: He is oriented to person, place, and time. He appears well-developed and well-nourished. No distress.  HENT:  Head: Normocephalic and atraumatic.  Eyes: Conjunctivae normal and EOM are normal.  Neck: Normal range of motion. Neck supple.  Cardiovascular: Normal rate and regular rhythm.  Exam reveals no gallop and no friction rub.   No murmur heard. Pulmonary/Chest: Effort normal and breath sounds normal. He has no wheezes. He has no rales. He exhibits no tenderness.  Abdominal: Soft. He exhibits no distension. There is no tenderness. There is no rebound.       RUQ tenderness to palpation.   Musculoskeletal: Normal range of motion.  Neurological: He is alert and oriented to person, place, and time. Coordination normal.       Speech is goal-oriented. Moves limbs without ataxia.   Skin: Skin is warm and dry. He is not diaphoretic.  Psychiatric: He has a normal mood and affect. His behavior is normal.  ED Course  Procedures (including critical care time)   Labs Reviewed  CBC WITH DIFFERENTIAL  COMPREHENSIVE METABOLIC PANEL  LIPASE, BLOOD   No results found.   1. Chronic RUQ pain       MDM  9:48 PM Basic labs pending. Patient will have fluids, morphine, and zofran for symptoms.   12:31 AM Labs unremarkable. Patient will be discharged with pain medication. Patient has a GI follow up scheduled. NO further evaluation needed at this time.        Emilia Beck, [place] 10/22/12 [phone]

## 2012-10-21 NOTE — ED Notes (Signed)
PA at bedside.

## 2012-10-21 NOTE — ED Notes (Signed)
Pt presents with right upper quadrant abdominal pain.  Pt has had ongoing pain for a year.  Pt has been belching "an absurd amount"

## 2012-10-22 LAB — COMPREHENSIVE METABOLIC PANEL
Albumin: 4.9 g/dL (ref 3.5–5.2)
BUN: 15 mg/dL (ref 6–23)
Calcium: 10 mg/dL (ref 8.4–10.5)
Chloride: 101 mEq/L (ref 96–112)
Creatinine, Ser: 0.88 mg/dL (ref 0.50–1.35)
Total Bilirubin: 0.6 mg/dL (ref 0.3–1.2)
Total Protein: 7.6 g/dL (ref 6.0–8.3)

## 2012-10-22 LAB — CBC WITH DIFFERENTIAL/PLATELET
Basophils Absolute: 0.1 10*3/uL (ref 0.0–0.1)
Eosinophils Absolute: 0.3 10*3/uL (ref 0.0–0.7)
HCT: 44 % (ref 39.0–52.0)
Lymphs Abs: 2.7 10*3/uL (ref 0.7–4.0)
MCH: 32 pg (ref 26.0–34.0)
MCHC: 35.2 g/dL (ref 30.0–36.0)
MCV: 90.7 fL (ref 78.0–100.0)
Monocytes Absolute: 0.4 10*3/uL (ref 0.1–1.0)
Neutro Abs: 2.7 10*3/uL (ref 1.7–7.7)
Platelets: 245 10*3/uL (ref 150–400)
RDW: 12.4 % (ref 11.5–15.5)

## 2012-10-22 LAB — LIPASE, BLOOD: Lipase: 21 U/L (ref 11–59)

## 2012-10-22 MED ORDER — MORPHINE SULFATE 30 MG PO TABS: 15.0000 mg | ORAL_TABLET | ORAL | Status: DC | PRN

## 2012-10-23 NOTE — ED Provider Notes (Signed)
Medical screening examination/treatment/procedure(s) were performed by non-physician practitioner and as supervising physician I was immediately available for consultation/collaboration.  Derwood Kaplan, MD 10/23/12 [phone]

## 2012-10-24 ENCOUNTER — Encounter (INDEPENDENT_AMBULATORY_CARE_PROVIDER_SITE_OTHER): Payer: Self-pay | Admitting: General Surgery

## 2012-10-24 NOTE — Progress Notes (Signed)
Patient ID: Terry Moreno, male   DOB: 1991-01-05, 21 y.o.   MRN: [id number]  Chief Complaint  Patient presents with  . Pre-op Exam    eval RUQ pain /GB    HPI Terry Moreno is a 21 y.o. male.  Referred by Dr. Foy Guadalajara HPI 21 yom who last July began having symptoms of belching, pain in RUQ and back, down right arm (in distribution of little finger), right leg pain, right neck pain.  This is worse with eating.  This does have nausea, no emesis.  This occurred from July to December 2012.  Then from January to May of 2013 these symptoms were gone.  He says they were completely better at that time.  In July 2013, all the symptoms listed above returned and they are now progressive.  He doesn't eat much now.  The belching is worse.  He continues to have the pains in his right leg and arm associated with this as well.  He does state it occurs with food esp fatty foods.  He has been seen by PCP and GI. He has undergone a thorough evaluation.  He reports he had egd which showed some mild reflux.  I do not have that report.  He also states he has nl ruq u/s but I also do not have that report.  He has a trial of PPIs (multiple including prilosec, nexium and dexilant).  He has also undergone some allergy testing.  He comes in today to see if this might be related to gb.  He has a ct a/p that is normal.  He has a hida scan that is normal with ef over 70% and no reproduction of pain.    Past Medical History  Diagnosis Date  . Anxiety   . Seasonal allergies   . Acid reflux     Past Surgical History  Procedure Date  . Tympanostomy tube placement     in the past  . Wisdom tooth extraction 2008    Family History  Problem Relation Age of Onset  . Anxiety disorder Mother   . Anxiety disorder Father   . Depression Father   . Cancer Maternal Grandfather     lukemia  . Cancer Paternal Grandfather     pancreatic    Social History History  Substance Use Topics  . Smoking status: Current Every Day Smoker  .  Smokeless tobacco: Never Used  . Alcohol Use: 0.6 oz/week    1 Cans of beer per week     Comment: Has stopped because of current complaint.    No Known Allergies  Current Outpatient Prescriptions  Medication Sig Dispense Refill  . cetirizine (ZYRTEC) 10 MG tablet Take 10 mg by mouth daily.      . Melatonin 10 MG CAPS Take 1 capsule by mouth at bedtime.      Marland Kitchen morphine (MSIR) 30 MG tablet Take 0.5 tablets (15 mg total) by mouth every 4 (four) hours as needed for pain.  15 tablet  0    Review of Systems Review of Systems  Constitutional: Positive for unexpected weight change. Negative for fever and chills.  HENT: Positive for trouble swallowing. Negative for hearing loss, congestion, sore throat and voice change.   Eyes: Negative for visual disturbance.  Respiratory: Negative for cough and wheezing.   Cardiovascular: Positive for chest pain and leg swelling. Negative for palpitations.  Gastrointestinal: Positive for nausea, abdominal pain and constipation. Negative for vomiting, diarrhea, blood in stool, abdominal distention, anal bleeding and  rectal pain.  Genitourinary: Negative for hematuria and difficulty urinating.  Musculoskeletal: Negative for arthralgias.  Skin: Negative for rash and wound.  Neurological: Positive for weakness and headaches. Negative for seizures and syncope.  Hematological: Negative for adenopathy. Does not bruise/bleed easily.  Psychiatric/Behavioral: Negative for confusion.    Blood pressure 106/70, pulse 74, temperature 98.6 F (37 C), temperature source Temporal, height 6\' 1"  (1.854 m), weight 145 lb 9.6 oz (66.044 kg), SpO2 99.00%.  Physical Exam Physical Exam  Vitals reviewed. Constitutional: He appears well-developed and well-nourished.  Eyes: No scleral icterus.  Cardiovascular: Normal rate, regular rhythm and normal heart sounds.   Pulmonary/Chest: Effort normal and breath sounds normal. He has no wheezes. He has no rales.  Abdominal: Soft.  Normal appearance and bowel sounds are normal. He exhibits no distension. There is no tenderness. No hernia.  Lymphadenopathy:    He has no cervical adenopathy.    Data Reviewed HIDA, ct a/p, meckels scan reviewed, [place] told to me by patient  Assessment    Abdominal, back, leg pain    Plan    I had a long discussion with he and his father today.  I don't really see any objective evidence that cholecystectomy is going to help his pain.  He has no abnormal test.  His symptoms are not classic although some may be related to his gallbladder.  He has many atypical symptoms and the 6 month period where this went away really doesn't make me think this is his gb either.  I think best plan would be for second opinion possibly at tertiary center with GI. I would be happy to see him again but cholecystectomy would really be last ditch effort that I think success rate is minimal with risks of operation and some long term issues as well.       Allycia Pitz 10/24/2012, 8:47 AM

## 2012-10-27 ENCOUNTER — Emergency Department (HOSPITAL_COMMUNITY)
Admission: EM | Admit: 2012-10-27 | Discharge: 2012-10-27 | Disposition: A | Discharge status: Home / Self Care | Payer: BC Managed Care – PPO | Attending: Emergency Medicine | Admitting: Emergency Medicine

## 2012-10-27 ENCOUNTER — Encounter (HOSPITAL_COMMUNITY): Payer: Self-pay | Admitting: Emergency Medicine

## 2012-10-27 DIAGNOSIS — R1011 Right upper quadrant pain: Secondary | ICD-10-CM | POA: Insufficient documentation

## 2012-10-27 DIAGNOSIS — Z79899 Other long term (current) drug therapy: Secondary | ICD-10-CM | POA: Insufficient documentation

## 2012-10-27 DIAGNOSIS — J309 Allergic rhinitis, unspecified: Secondary | ICD-10-CM | POA: Insufficient documentation

## 2012-10-27 DIAGNOSIS — R109 Unspecified abdominal pain: Secondary | ICD-10-CM

## 2012-10-27 DIAGNOSIS — F3289 Other specified depressive episodes: Secondary | ICD-10-CM | POA: Insufficient documentation

## 2012-10-27 DIAGNOSIS — F329 Major depressive disorder, single episode, unspecified: Secondary | ICD-10-CM | POA: Insufficient documentation

## 2012-10-27 DIAGNOSIS — F17200 Nicotine dependence, unspecified, uncomplicated: Secondary | ICD-10-CM | POA: Insufficient documentation

## 2012-10-27 DIAGNOSIS — F32A Depression, unspecified: Secondary | ICD-10-CM

## 2012-10-27 DIAGNOSIS — Z8719 Personal history of other diseases of the digestive system: Secondary | ICD-10-CM | POA: Insufficient documentation

## 2012-10-27 DIAGNOSIS — F411 Generalized anxiety disorder: Secondary | ICD-10-CM | POA: Insufficient documentation

## 2012-10-27 MED ORDER — FENTANYL CITRATE 0.05 MG/ML IJ SOLN
INTRAMUSCULAR | Status: AC
  Administered 2012-10-27: 10:00:00
  Filled 2012-10-27: qty 2

## 2012-10-27 MED ORDER — DICYCLOMINE HCL 20 MG PO TABS: 20.0000 mg | ORAL_TABLET | Freq: Two times a day (BID) | ORAL | Status: DC

## 2012-10-27 MED ORDER — HYDROCODONE-ACETAMINOPHEN 5-325 MG PO TABS: 1.0000 | ORAL_TABLET | Freq: Three times a day (TID) | ORAL | Status: DC | PRN

## 2012-10-27 MED ORDER — LORAZEPAM 2 MG/ML IJ SOLN
1.0000 mg | Freq: Once | INTRAMUSCULAR | Status: AC
  Administered 2012-10-27: 2 mg via INTRAMUSCULAR
  Filled 2012-10-27: qty 1

## 2012-10-27 MED ORDER — LORAZEPAM 2 MG/ML IJ SOLN: 1.0000 mg | Freq: Once | INTRAMUSCULAR | Status: DC

## 2012-10-27 MED ORDER — LORAZEPAM 2 MG/ML IJ SOLN
1.0000 mg | Freq: Once | INTRAMUSCULAR | Status: AC
  Administered 2012-10-27: 1 mg via INTRAMUSCULAR
  Filled 2012-10-27: qty 1

## 2012-10-27 MED ORDER — HYDROMORPHONE HCL PF 1 MG/ML IJ SOLN
1.0000 mg | Freq: Once | INTRAMUSCULAR | Status: AC
  Administered 2012-10-27: 1 mg via INTRAMUSCULAR
  Filled 2012-10-27: qty 1

## 2012-10-27 MED ORDER — DICYCLOMINE HCL 10 MG PO CAPS
20.0000 mg | ORAL_CAPSULE | Freq: Once | ORAL | Status: AC
  Administered 2012-10-27: 20 mg via ORAL
  Filled 2012-10-27: qty 2

## 2012-10-27 MED ORDER — HYDROMORPHONE HCL PF 1 MG/ML IJ SOLN: 1.0000 mg | Freq: Once | INTRAMUSCULAR | Status: DC

## 2012-10-27 MED ORDER — ONDANSETRON HCL 4 MG/2ML IJ SOLN
4.0000 mg | Freq: Once | INTRAMUSCULAR | Status: AC
  Administered 2012-10-27: 4 mg via INTRAVENOUS
  Filled 2012-10-27: qty 2

## 2012-10-27 NOTE — ED Provider Notes (Signed)
History    21 year old male with abdominal pain. Right upper quadrant. Pain has been going on for over a year. Intermittent. Worse recently. Does not radiate. Often worse 5-30 minutes after eating. Reports it has been on PPIs previously without much relief. Patient with multiple evaluations is in the emergency room and as an outpatient for the same. No clear etiology at this point. Per review of records patient has had CT of the abdomen pelvis and HIDA scan which were normal. Surgical evaluation about a week ago. Patient reports evaluation by gastroenterology as well. Patient states that he was supposed to see gastroenterology today, but his pain was so severe that he had to go to the emergency room at [facility] in [place]. He was discharged and subsequently returned to the emergency room again b/c of continued pain.  CSN: [id number]  Arrival date & time 10/27/12  0110   None     Chief Complaint  Patient presents with  . Abdominal Pain    (Consider location/radiation/quality/duration/timing/severity/associated sxs/prior treatment) HPI  Past Medical History  Diagnosis Date  . Anxiety   . Seasonal allergies   . Acid reflux     Past Surgical History  Procedure Date  . Tympanostomy tube placement     in the past  . Wisdom tooth extraction 2008    Family History  Problem Relation Age of Onset  . Anxiety disorder Mother   . Anxiety disorder Father   . Depression Father   . Cancer Maternal Grandfather     lukemia  . Cancer Paternal Grandfather     pancreatic    History  Substance Use Topics  . Smoking status: Current Every Day Smoker  . Smokeless tobacco: Never Used  . Alcohol Use: 0.6 oz/week    1 Cans of beer per week     Comment: Has stopped because of current complaint.      Review of Systems   Review of symptoms negative unless otherwise noted in HPI.   Allergies  Review of patient's allergies indicates no known allergies.  Home Medications   Current  Outpatient Rx  Name  Route  Sig  Dispense  Refill  . CETIRIZINE HCL 10 MG PO TABS   Oral   Take 10 mg by mouth daily.         Marland Kitchen MELATONIN 10 MG PO CAPS   Oral   Take 1 capsule by mouth at bedtime.         . MORPHINE SULFATE 30 MG PO TABS   Oral   Take 0.5 tablets (15 mg total) by mouth every 4 (four) hours as needed for pain.   15 tablet   0     BP 100/63  Pulse 88  Temp 96.7 F (35.9 C) (Oral)  Resp 16  SpO2 100%  Physical Exam  Nursing note and vitals reviewed. Constitutional: He appears well-developed and well-nourished. No distress.       Laying in bed. No acute distress.  HENT:  Head: Normocephalic and atraumatic.  Eyes: Conjunctivae normal are normal. Right eye exhibits no discharge. Left eye exhibits no discharge.  Neck: Neck supple.  Cardiovascular: Normal rate, regular rhythm and normal heart sounds.  Exam reveals no gallop and no friction rub.   No murmur heard. Pulmonary/Chest: Effort normal and breath sounds normal. No respiratory distress. He has no wheezes. He exhibits no tenderness.       No tenderness to the chest wall. No crepitus. No concerning skin lesions noted. No increased work  of breathing. Breath sounds are equal bilaterally with good air movement  Abdominal: Soft. He exhibits no distension. There is tenderness.       Mild tenderness the right upper quadrant without rebound or guarding. Abdomen is normal to inspection. There is no distention. No mass palpated.  Genitourinary:       No costovertebral angle tenderness  Musculoskeletal: He exhibits no edema and no tenderness.  Neurological: He is alert.  Skin: Skin is warm. He is not diaphoretic.  Psychiatric: His behavior is normal. Thought content normal.       Mildly anxious     ED Course  Procedures (including critical care time)  Labs Reviewed - No data to display No results found.   1. Abdominal pain       MDM  21 year old male with abdominal pain. Right upper quadrant. This  is chronic in nature but progressively worsening. Patient with multiple recent evaluations for the same.  Previous workups been fairly unremarkable including CT abdomen pelvis and HIDA scans. Patient was evaluated by surgery approximately a week ago and appears that did not feel that cholecystectomy would necessarily be of benefit. Patient self-reports evaluation by gastroenterology and EGD which was unremarkable. He states was actually supposed to see Dr Dulce Sellar, GI, today but had to go the emergency room at [facility] instead because his pain was just too severe. Coming back to ER because of persistent pain. Mother "demanding" that pt be emergently evaluated by GI and surgery. There is no indication for this. He does have some RUQ tenderness but doesn't seem to be particularly severe and no peritoneal signs. Pt reports excrutiating pain although doesn't appear to be distressed. Will tx symptomatically at this time. Given chronicity of complaints and extensive prior evaluations, do not feel that further testing at this time of much utility. Parents visibly upset and frustrated. Report that patient suicidal because of pain. He is not. He says that when the pain is so bad he sometimes wishes he would just die, but says he wouldn't kill himself and hasn't had any specific thoughts. Will obtain telepsych consult at parent request. Will discuss with GI because mother says they talked to them today.   Discussed case with Madilyn Fireman, gastroenterology. Will be happy to see patient in followup but agree that there is no need for emergent evaluation. Telepsych pending.   8:52 AM Telepsych has been completed but assessment is still pending. Apparently issues with transmission. Disposition pending psych recommendations. Patient and parents updated. Discussed with Dr Jeraldine Loots. Pt has GI appointment on Monday.      Raeford Razor, MD 10/27/12 [phone]

## 2012-10-27 NOTE — ED Notes (Signed)
Telepsych in progress. Tech at bedside. 

## 2012-10-27 NOTE — ED Notes (Signed)
Tech reported that family came to nurses desk to report pts pain becoming worse. Spoke with pt ab options for medication. Pt refused wanting to have pain medication. Pt agreed to wait to have dr come and see him.

## 2012-10-27 NOTE — ED Provider Notes (Addendum)
1:29 PM I went to check on the patient.  Tele psych results still pending.  The patient is sleeping.    Gerhard Munch, MD 10/27/12 1329  3:42 PM Patient sitting upright, tolerated lunch.  I discussed the telepsych rec's (D/C w outpatient F/U) w patient and family.  Gerhard Munch, MD 10/27/12 [phone]

## 2012-10-27 NOTE — ED Notes (Signed)
Patient was seen and treated for abdominal pain on Friday and started on Morphine. The patient continues to have pain to his right lower abdominal area. The patient was in so much pain tonight that he ask for someone to "kill him" to take the apin away

## 2012-10-31 ENCOUNTER — Ambulatory Visit (INDEPENDENT_AMBULATORY_CARE_PROVIDER_SITE_OTHER): Payer: Self-pay | Admitting: Surgery

## 2012-11-02 ENCOUNTER — Encounter (HOSPITAL_COMMUNITY): Payer: Self-pay | Admitting: Pharmacy Technician

## 2012-11-03 ENCOUNTER — Encounter (HOSPITAL_COMMUNITY): Payer: Self-pay | Admitting: *Deleted

## 2012-11-03 NOTE — Pre-Procedure Instructions (Addendum)
Your procedure is scheduled on: Monday, December 09,2013 Report to [facility] Admitting [id number] Call this number if you have problems morning of your [phone]  Follow all bowel prep instructions per your doctor's orders.  Do not eat or drink anything after midnight the night before your procedure. You may brush your teeth, rinse out your mouth, but no water, no food, no chewing gum, no mints, no candies, no chewing tobacco.     Take these medicines the morning of your procedure with A SIP OF WATER:May take Hydrocodone if needed.   Please make arrangements for a responsible person to drive you home after the procedure. You cannot go home by cab/taxi. We recommend you have someone with you at home the first 24 hours after your procedure. Driver for procedure is mother Okey Regal  LEAVE ALL VALUABLES, JEWELRY, BILLFOLD AT HOME.  NO DENTURES, CONTACT LENSES ALLOWED IN THE ENDOSCOPY ROOM.   YOU MAY WEAR DEODORANT, PLEASE REMOVE ALL JEWELRY, WATCHES RINGS, BODY PIERCINGS AND LEAVE AT HOME.   WOMEN: NO MAKE-UP, LOTIONS PERFUMES

## 2012-11-07 ENCOUNTER — Encounter (HOSPITAL_COMMUNITY): Payer: Self-pay | Admitting: Anesthesiology

## 2012-11-07 ENCOUNTER — Ambulatory Visit (HOSPITAL_COMMUNITY)
Admission: RE | Admit: 2012-11-07 | Discharge: 2012-11-07 | Disposition: A | Discharge status: Home / Self Care | Payer: BC Managed Care – PPO | Source: Ambulatory Visit | Attending: Gastroenterology | Admitting: Gastroenterology

## 2012-11-07 ENCOUNTER — Encounter (HOSPITAL_COMMUNITY): Payer: Self-pay | Admitting: Gastroenterology

## 2012-11-07 ENCOUNTER — Ambulatory Visit (HOSPITAL_COMMUNITY): Payer: BC Managed Care – PPO | Admitting: Anesthesiology

## 2012-11-07 ENCOUNTER — Encounter (HOSPITAL_COMMUNITY)
Admission: RE | Disposition: A | Discharge status: Home / Self Care | Payer: Self-pay | Source: Ambulatory Visit | Attending: Gastroenterology

## 2012-11-07 DIAGNOSIS — R1011 Right upper quadrant pain: Secondary | ICD-10-CM | POA: Insufficient documentation

## 2012-11-07 DIAGNOSIS — K219 Gastro-esophageal reflux disease without esophagitis: Secondary | ICD-10-CM | POA: Insufficient documentation

## 2012-11-07 DIAGNOSIS — K200 Eosinophilic esophagitis: Secondary | ICD-10-CM | POA: Insufficient documentation

## 2012-11-07 HISTORY — PX: ESOPHAGOGASTRODUODENOSCOPY (EGD) WITH PROPOFOL: SHX5813

## 2012-11-07 HISTORY — PX: EUS: SHX5427

## 2012-11-07 SURGERY — ESOPHAGEAL ENDOSCOPIC ULTRASOUND (EUS) RADIAL
Anesthesia: Monitor Anesthesia Care

## 2012-11-07 MED ORDER — PROPOFOL 10 MG/ML IV EMUL
INTRAVENOUS | Status: DC | PRN
  Administered 2012-11-07: 75 ug/kg/min via INTRAVENOUS

## 2012-11-07 MED ORDER — LACTATED RINGERS IV SOLN
INTRAVENOUS | Status: DC | PRN
  Administered 2012-11-07: 10:00:00 via INTRAVENOUS

## 2012-11-07 MED ORDER — MIDAZOLAM HCL 5 MG/5ML IJ SOLN
INTRAMUSCULAR | Status: DC | PRN
  Administered 2012-11-07: 2 mg via INTRAVENOUS

## 2012-11-07 MED ORDER — SODIUM CHLORIDE 0.9 % IV SOLN: INTRAVENOUS | Status: DC

## 2012-11-07 MED ORDER — FENTANYL CITRATE 0.05 MG/ML IJ SOLN
INTRAMUSCULAR | Status: DC | PRN
  Administered 2012-11-07: 25 ug via INTRAVENOUS

## 2012-11-07 MED ORDER — BUTAMBEN-TETRACAINE-BENZOCAINE 2-2-14 % EX AERO
INHALATION_SPRAY | CUTANEOUS | Status: DC | PRN
  Administered 2012-11-07: 2 via TOPICAL

## 2012-11-07 MED ORDER — PROPOFOL 10 MG/ML IV BOLUS
INTRAVENOUS | Status: DC | PRN
  Administered 2012-11-07 (×6): 20 mg via INTRAVENOUS

## 2012-11-07 SURGICAL SUPPLY — 14 items

## 2012-11-07 NOTE — Transfer of Care (Signed)
Immediate Anesthesia Transfer of Care Note  Patient: Terry Moreno  Procedure(s) Performed: Procedure(s) (LRB): ESOPHAGEAL ENDOSCOPIC ULTRASOUND (EUS) RADIAL (N/A) ESOPHAGOGASTRODUODENOSCOPY (EGD) WITH PROPOFOL (N/A)  Patient Location: PACU  Anesthesia Type: MAC  Level of Consciousness: sedated, patient cooperative and responds to stimulaton  Airway & Oxygen Therapy: Patient Spontanous Breathing and Patient connected to face mask oxgen  Post-op Assessment: Report given to PACU RN and Post -op Vital signs reviewed and stable  Post vital signs: Reviewed and stable  Complications: No apparent anesthesia complications

## 2012-11-07 NOTE — Anesthesia Preprocedure Evaluation (Signed)
Anesthesia Evaluation  Patient identified by MRN, date of birth, ID band Patient awake    Reviewed: Allergy & Precautions, H&P , NPO status , Patient's Chart, lab work & pertinent test results, reviewed documented beta blocker date and time   Airway Mallampati: II TM Distance: >3 FB Neck ROM: full    Dental No notable dental hx.    Pulmonary Current Smoker,  breath sounds clear to auscultation  Pulmonary exam normal       Cardiovascular Exercise Tolerance: Good negative cardio ROS  Rhythm:regular Rate:Normal     Neuro/Psych PSYCHIATRIC DISORDERS negative neurological ROS  negative psych ROS   GI/Hepatic Neg liver ROS, GERD-  ,  Endo/Other  negative endocrine ROS  Renal/GU negative Renal ROS  negative genitourinary   Musculoskeletal   Abdominal   Peds  Hematology negative hematology ROS (+)   Anesthesia Other Findings   Reproductive/Obstetrics negative OB ROS                           Anesthesia Physical Anesthesia Plan  ASA: II  Anesthesia Plan: MAC   Post-op Pain Management:    Induction:   Airway Management Planned:   Additional Equipment:   Intra-op Plan:   Post-operative Plan:   Informed Consent: I have reviewed the patients History and Physical, chart, labs and discussed the procedure including the risks, benefits and alternatives for the proposed anesthesia with the patient or authorized representative who has indicated his/her understanding and acceptance.   Dental Advisory Given  Plan Discussed with: CRNA  Anesthesia Plan Comments:         Anesthesia Quick Evaluation

## 2012-11-07 NOTE — Op Note (Signed)
[facility] [address] [place], [address]   ENDOSCOPIC ULTRASOUND PROCEDURE REPORT  PATIENT: Terry, Moreno  MR#: [id number] BIRTHDATE: [birth year]  GENDER: Male ENDOSCOPIST: Willis Modena, MD REFERRED BY:  Marinda Elk, MD; Emelia Loron, MD. PROCEDURE DATE:  11/07/2012 PROCEDURE:   Upper EUS ASA CLASS:      Class II INDICATIONS:   1.  right upper quadrant abdominal pain. MEDICATIONS: MAC sedation, administered by CRNA and Cetacaine spray x 2  DESCRIPTION OF PROCEDURE:   After the risks benefits and alternatives of the procedure were  explained, informed consent was obtained. The patient was then placed in the left, lateral, decubitus postion and IV sedation was administered. Throughout the procedure, the patients blood pressure, pulse and oxygen saturations were monitored continuously.  Under direct visualization, the Pentax Radial EUS [id number]  endoscope was introduced through the mouth  and advanced to the second portion of the duodenum .  Water was used as necessary to provide an acoustic interface.  Upon completion of the imaging, water was removed and the patient was sent to the recovery room in satisfactory condition.     FINDINGS:      EGD:  Linear furrows seen throughout the esophagus, unclear significance, biopsied to evaluate for eosinophilic esophagitis.  Stomach, pylorus and duodenum to the second portion was normal; random duodenal biopsies taken to assess for celiac sprue. EUS:  Extensive mild inflammatory changes throughout the entire pancreas with some lobularity; findings are of unclear significance; no pancreatic mass, cyst, or peripancreatic adenopathy.  Gallbladder appears normal without wall thickening, stones, or sludge.  Normal caliber bile duct without wall thickening or choledocholithiasis.  IMPRESSION:     As above.  RECOMMENDATIONS:     1.  Watch for potential complications of procedure. 2.  Await biopsy results. 3.   Will discuss case with Dr. Dwain Sarna concerning possibility of cholecystectomy. 4.  Follow-up in [facility] in 6-8 weeks.   _______________________________ Rosalie DoctorWillis Modena, MD 11/07/2012 11:18 AM   CC:

## 2012-11-07 NOTE — H&P (Signed)
Patient interval history reviewed.  Patient examined again.  There has been no change from documented H/P dated 10/31/12 (scanned into chart from our office) except as documented above.  Assessment:  1.  Right-sided abdominal pain.  Plan:  1.  Endoscopy (EGD) and endoscopic ultrasound. 2.  Risks (bleeding, infection, bowel perforation that could require surgery, sedation-related changes in cardiopulmonary systems), benefits (identification and possible treatment of source of symptoms, exclusion of certain causes of symptoms), and alternatives (watchful waiting, radiographic imaging studies, empiric medical treatment) of upper endoscopy with and without ultrasound (EGD + EUS) were explained to patient in detail and he wishes to proceed.

## 2012-11-07 NOTE — Anesthesia Postprocedure Evaluation (Signed)
  Anesthesia Post-op Note  Patient: Terry Moreno  Procedure(s) Performed: Procedure(s) (LRB): ESOPHAGEAL ENDOSCOPIC ULTRASOUND (EUS) RADIAL (N/A) ESOPHAGOGASTRODUODENOSCOPY (EGD) WITH PROPOFOL (N/A)  Patient Location: PACU  Anesthesia Type: MAC  Level of Consciousness: awake and alert   Airway and Oxygen Therapy: Patient Spontanous Breathing  Post-op Pain: mild  Post-op Assessment: Post-op Vital signs reviewed, Patient's Cardiovascular Status Stable, Respiratory Function Stable, Patent Airway and No signs of Nausea or vomiting  Last Vitals:  Filed Vitals:   11/07/12 1140  BP:   Temp:   Resp: 16    Post-op Vital Signs: stable   Complications: No apparent anesthesia complications

## 2012-11-08 ENCOUNTER — Encounter (HOSPITAL_COMMUNITY): Payer: Self-pay | Admitting: Gastroenterology

## 2012-11-08 ENCOUNTER — Telehealth (INDEPENDENT_AMBULATORY_CARE_PROVIDER_SITE_OTHER): Payer: Self-pay | Admitting: General Surgery

## 2012-11-08 NOTE — Telephone Encounter (Signed)
Message copied by Liliana Cline on Tue Nov 08, 2012  4:58 PM ------      Message from: [place], [place]      Created: Tue Nov 08, 2012  2:58 PM      Regarding: Appt       This is patient I have seen before. He saw dr Dulce Sellar. I am happy to see him back next week to discuss possibly removing gb. Or I would be willing to see tomorrow after cases. Don't know if he wants to come back to me for sure but I am willing to discuss surgery if they want to.       MW

## 2012-11-08 NOTE — Telephone Encounter (Signed)
Appt made for next Tuesday with Dr Dwain Sarna.

## 2012-11-09 ENCOUNTER — Telehealth (INDEPENDENT_AMBULATORY_CARE_PROVIDER_SITE_OTHER): Payer: Self-pay | Admitting: General Surgery

## 2012-11-09 NOTE — Telephone Encounter (Signed)
Mother called, very tearful, to see if Dr. Dwain Sarna could see pt sooner than next week.  He is still in significant pain, has been to ER 5 times and has lost over 30 lbs.  She says her son "is just giving up" and says he "doesn't want to go on living with this pain."  Discussed with Dr. Dwain Sarna: advised to go to ER, especially if considering suicide.  Called Mom with this advice.  She stated he was seen by a psychiatrist in the ER already.  Mother doesn't think he would really hurt himself, but is just frustrated and in so much pain this his how he is expressing himself.   Reiterated to go back to the ER if pain worsens or he verbalizes more about not wanting to live with the pain.  She understands.

## 2012-11-11 ENCOUNTER — Ambulatory Visit (INDEPENDENT_AMBULATORY_CARE_PROVIDER_SITE_OTHER): Payer: BC Managed Care – PPO | Admitting: General Surgery

## 2012-11-11 ENCOUNTER — Encounter (INDEPENDENT_AMBULATORY_CARE_PROVIDER_SITE_OTHER): Payer: Self-pay | Admitting: General Surgery

## 2012-11-11 VITALS — BP 100/70 | HR 72 | Temp 97.5°F | Resp 14 | Ht 72.0 in | Wt 137.8 lb

## 2012-11-11 DIAGNOSIS — R1011 Right upper quadrant pain: Secondary | ICD-10-CM

## 2012-11-11 NOTE — Progress Notes (Signed)
Subjective:     Patient ID: Terry Moreno, male   DOB: 01/03/1991, 21 y.o.   MRN: [id number]  HPI  This is a 21-year-old male whose history is documented in my prior note. Since our last visit he is obtained a second opinion from Dr. Outlaw. He actually has done an endoscopic ultrasound that is essentially normal. There were some subtle changes of the pancreas that I certainly don't think are causing any of his pain. I've spoken to Dr. Outlaw about his case who thinks cholecystectomy is reasonable and I had the patient returned today. In summary he now still has really no changes in his symptoms. They continued the same. He has basically all normal tests with some atypical symptoms as well as some symptoms that might be referable to his gallbladder. He and his mom returned today for a conversation about possible treatment options at this point. Review of Systems     Objective:   Physical Exam    deferred Assessment:    RUQ pain    Plan:     We discussed the options including continued evaluation for medical issues for his pain is is being done by his primary care physician. We discussed the option of a cholecystectomy. I discussed this with he and his lmom going through all of his negative tests. I told him the success of an operation to relieve his symptoms is less than 50% of the time. I told him specifically that we may do surgery and this may not relieve any of the symptoms that he has. Both he and his mother voiced their understanding to this. I don't think there is really any other evaluation due for his symptoms right now. Certainly some of these could be related to his gallbladder. I don't think there is any other tests to determine this at this point outside of just proceeding and doing a laparoscopic cholecystectomy. We had a long talk again about the fact that this may not cure any of his symptoms and that he would need further evaluation if that were the case. I discussed the short-term  risks of gallbladder surgery including bleeding, infection, further procedures, open surgery, common bile duct injury as well as an op helping his symptoms. I discussed the long-term issues with diarrhea as well as a change in his diet he may need occurs well. Both he and his mother agreed that this is a reasonable next And again voiced their understanding that this may not help his symptoms. He is so miserable right now that he is willing to try this.      

## 2012-11-14 ENCOUNTER — Other Ambulatory Visit (HOSPITAL_COMMUNITY): Payer: Self-pay | Admitting: Family Medicine

## 2012-11-14 ENCOUNTER — Encounter (HOSPITAL_COMMUNITY): Payer: Self-pay

## 2012-11-14 ENCOUNTER — Ambulatory Visit (HOSPITAL_COMMUNITY): Payer: BC Managed Care – PPO

## 2012-11-14 ENCOUNTER — Encounter (HOSPITAL_COMMUNITY): Payer: Self-pay | Admitting: Pharmacy Technician

## 2012-11-14 ENCOUNTER — Encounter (HOSPITAL_COMMUNITY)
Admission: RE | Admit: 2012-11-14 | Discharge: 2012-11-14 | Disposition: A | Discharge status: Home / Self Care | Payer: BC Managed Care – PPO | Source: Ambulatory Visit | Attending: General Surgery | Admitting: General Surgery

## 2012-11-14 DIAGNOSIS — R109 Unspecified abdominal pain: Secondary | ICD-10-CM

## 2012-11-14 LAB — COMPREHENSIVE METABOLIC PANEL
ALT: 17 U/L (ref 0–53)
AST: 18 U/L (ref 0–37)
Albumin: 4.6 g/dL (ref 3.5–5.2)
Alkaline Phosphatase: 54 U/L (ref 39–117)
Potassium: 3.6 mEq/L (ref 3.5–5.1)
Sodium: 140 mEq/L (ref 135–145)
Total Protein: 7 g/dL (ref 6.0–8.3)

## 2012-11-14 LAB — CBC WITH DIFFERENTIAL/PLATELET
Basophils Relative: 0 % (ref 0–1)
Eosinophils Absolute: 0.9 10*3/uL — ABNORMAL HIGH (ref 0.0–0.7)
Lymphs Abs: 3.3 10*3/uL (ref 0.7–4.0)
MCH: 31.8 pg (ref 26.0–34.0)
MCHC: 35.4 g/dL (ref 30.0–36.0)
Neutrophils Relative %: 33 % — ABNORMAL LOW (ref 43–77)
Platelets: 245 10*3/uL (ref 150–400)
RBC: 4.68 MIL/uL (ref 4.22–5.81)

## 2012-11-14 NOTE — Patient Instructions (Addendum)
[address] [id number]  11/14/2012   Your procedure is scheduled on: 11-16-2012  Report to [facility] at  0800   AM.  Call this number if you have problems the morning of surgery: [phone]   Remember:mother carol cell [phone]   Do not eat food:After Midnight.  May have clear liquids:until Midnight .  Clear liquids include soda, tea, black coffee, apple or grape juice, broth.  Take these medicines the morning of surgery with A SIP OF WATER: hydrocodone if needed, zyrtec if needed, afrin nasal spray if needed   Do not wear jewelry, make-up or nail polish.  Do not wear lotions, powders, or perfumes. You may wear deodorant.  Do not shave 48 hours prior to surgery. Men may shave face and neck.  Do not bring valuables to the hospital.  Contacts, dentures or bridgework may not be worn into surgery.  Leave suitcase in the car. After surgery it may be brought to your room.  For patients admitted to the hospital, checkout time is 11:00 AM the day of  Discharge.   Patients discharged the day of surgery will not be allowed to drive home.  Name and phone number of your driver:   Special Instructions: Shower using CHG 2 nights before surgery and the   night before surgery.  If you shower the day of surgery use CHG.  Use     special wash - you have one bottle of CHG for all showers.  You should    use approximately 1/3 of the bottle for each shower. Men may shave face   morning of surgery. See [facility] preparing for surgery instruction                sheet.   Please read over the following fact sheets that you were given: MRSA    Information.                  Cain Sieve RN  Pre op nurse phone number [phone]  call if   questions.               FAILURE TO FOLLOW THESE INSTRUCTIONS MAY RESULT IN THE  CANCELLATION OF YOUR SURGERY.            PATIENT SIGNATURE______________________________________________

## 2012-11-15 ENCOUNTER — Encounter (INDEPENDENT_AMBULATORY_CARE_PROVIDER_SITE_OTHER): Payer: Self-pay

## 2012-11-15 ENCOUNTER — Encounter (INDEPENDENT_AMBULATORY_CARE_PROVIDER_SITE_OTHER): Payer: BC Managed Care – PPO | Admitting: General Surgery

## 2012-11-15 ENCOUNTER — Telehealth (INDEPENDENT_AMBULATORY_CARE_PROVIDER_SITE_OTHER): Payer: Self-pay | Admitting: General Surgery

## 2012-11-15 NOTE — Telephone Encounter (Signed)
Spoke with pt's mother and informed her that her son's first PO appt will be on 1/3 at 11:50.

## 2012-11-16 ENCOUNTER — Ambulatory Visit (HOSPITAL_COMMUNITY): Admission: RE | Admit: 2012-11-16 | Payer: BC Managed Care – PPO | Source: Ambulatory Visit | Admitting: General Surgery

## 2012-11-16 ENCOUNTER — Encounter (HOSPITAL_COMMUNITY): Admission: RE | Payer: Self-pay | Source: Ambulatory Visit

## 2012-11-16 SURGERY — LAPAROSCOPIC CHOLECYSTECTOMY WITH INTRAOPERATIVE CHOLANGIOGRAM
Anesthesia: General

## 2012-11-17 ENCOUNTER — Encounter (HOSPITAL_COMMUNITY): Payer: Self-pay | Admitting: *Deleted

## 2012-11-17 MED ORDER — DEXTROSE 5 % IV SOLN
2.0000 g | INTRAVENOUS | Status: AC
  Administered 2012-11-18 (×2): 2 g via INTRAVENOUS
  Filled 2012-11-17: qty 2

## 2012-11-18 ENCOUNTER — Encounter (HOSPITAL_COMMUNITY)
Admission: RE | Disposition: A | Discharge status: Home / Self Care | Payer: Self-pay | Source: Ambulatory Visit | Attending: General Surgery

## 2012-11-18 ENCOUNTER — Ambulatory Visit (HOSPITAL_COMMUNITY): Payer: BC Managed Care – PPO

## 2012-11-18 ENCOUNTER — Encounter (HOSPITAL_COMMUNITY): Payer: Self-pay | Admitting: Critical Care Medicine

## 2012-11-18 ENCOUNTER — Ambulatory Visit (HOSPITAL_COMMUNITY): Payer: BC Managed Care – PPO | Admitting: Critical Care Medicine

## 2012-11-18 ENCOUNTER — Encounter (HOSPITAL_COMMUNITY): Payer: Self-pay | Admitting: *Deleted

## 2012-11-18 ENCOUNTER — Ambulatory Visit (HOSPITAL_COMMUNITY)
Admission: RE | Admit: 2012-11-18 | Discharge: 2012-11-18 | Disposition: A | Discharge status: Home / Self Care | Payer: BC Managed Care – PPO | Source: Ambulatory Visit | Attending: General Surgery | Admitting: General Surgery

## 2012-11-18 DIAGNOSIS — R1011 Right upper quadrant pain: Secondary | ICD-10-CM | POA: Insufficient documentation

## 2012-11-18 DIAGNOSIS — K811 Chronic cholecystitis: Secondary | ICD-10-CM

## 2012-11-18 DIAGNOSIS — K200 Eosinophilic esophagitis: Secondary | ICD-10-CM | POA: Insufficient documentation

## 2012-11-18 HISTORY — DX: Deviated nasal septum: J34.2

## 2012-11-18 HISTORY — PX: CHOLECYSTECTOMY: SHX55

## 2012-11-18 SURGERY — LAPAROSCOPIC CHOLECYSTECTOMY WITH INTRAOPERATIVE CHOLANGIOGRAM
Anesthesia: General | Site: Abdomen | Wound class: Clean Contaminated

## 2012-11-18 MED ORDER — SODIUM CHLORIDE 0.9 % IV SOLN: INTRAVENOUS | Status: DC

## 2012-11-18 MED ORDER — GLYCOPYRROLATE 0.2 MG/ML IJ SOLN
INTRAMUSCULAR | Status: DC | PRN
  Administered 2012-11-18: 0.4 mg via INTRAVENOUS

## 2012-11-18 MED ORDER — OXYCODONE-ACETAMINOPHEN 5-325 MG PO TABS: 1.0000 | ORAL_TABLET | ORAL | Status: DC | PRN

## 2012-11-18 MED ORDER — KETOROLAC TROMETHAMINE 30 MG/ML IJ SOLN
INTRAMUSCULAR | Status: AC
  Administered 2012-11-18: 15 mg
  Filled 2012-11-18: qty 1

## 2012-11-18 MED ORDER — 0.9 % SODIUM CHLORIDE (POUR BTL) OPTIME
TOPICAL | Status: DC | PRN
  Administered 2012-11-18: 1000 mL

## 2012-11-18 MED ORDER — BUPIVACAINE-EPINEPHRINE 0.25% -1:200000 IJ SOLN
INTRAMUSCULAR | Status: DC | PRN
  Administered 2012-11-18: 13 mL

## 2012-11-18 MED ORDER — ACETAMINOPHEN 650 MG RE SUPP: 650.0000 mg | RECTAL | Status: DC | PRN

## 2012-11-18 MED ORDER — EPHEDRINE SULFATE 50 MG/ML IJ SOLN
INTRAMUSCULAR | Status: DC | PRN
  Administered 2012-11-18: 10 mg via INTRAVENOUS

## 2012-11-18 MED ORDER — ONDANSETRON HCL 4 MG/2ML IJ SOLN: 4.0000 mg | Freq: Four times a day (QID) | INTRAMUSCULAR | Status: DC | PRN

## 2012-11-18 MED ORDER — KETOROLAC TROMETHAMINE 15 MG/ML IJ SOLN: 15.0000 mg | Freq: Four times a day (QID) | INTRAMUSCULAR | Status: DC

## 2012-11-18 MED ORDER — HYDROMORPHONE HCL PF 1 MG/ML IJ SOLN
INTRAMUSCULAR | Status: AC
  Filled 2012-11-18: qty 1

## 2012-11-18 MED ORDER — OXYCODONE HCL 5 MG PO TABS
5.0000 mg | ORAL_TABLET | ORAL | Status: DC | PRN
  Administered 2012-11-18: 5 mg via ORAL

## 2012-11-18 MED ORDER — ROCURONIUM BROMIDE 100 MG/10ML IV SOLN
INTRAVENOUS | Status: DC | PRN
  Administered 2012-11-18: 30 mg via INTRAVENOUS
  Administered 2012-11-18: 10 mg via INTRAVENOUS

## 2012-11-18 MED ORDER — SODIUM CHLORIDE 0.9 % IJ SOLN: 3.0000 mL | Freq: Two times a day (BID) | INTRAMUSCULAR | Status: DC

## 2012-11-18 MED ORDER — OXYCODONE HCL 5 MG PO TABS
ORAL_TABLET | ORAL | Status: AC
  Filled 2012-11-18: qty 1

## 2012-11-18 MED ORDER — SODIUM CHLORIDE 0.9 % IR SOLN
Status: DC | PRN
  Administered 2012-11-18: 1

## 2012-11-18 MED ORDER — BUPIVACAINE-EPINEPHRINE PF 0.25-1:200000 % IJ SOLN
INTRAMUSCULAR | Status: AC
  Filled 2012-11-18: qty 30

## 2012-11-18 MED ORDER — LACTATED RINGERS IV SOLN
INTRAVENOUS | Status: DC | PRN
  Administered 2012-11-18: 12:00:00 via INTRAVENOUS

## 2012-11-18 MED ORDER — HYDROMORPHONE HCL PF 1 MG/ML IJ SOLN
0.2500 mg | INTRAMUSCULAR | Status: DC | PRN
  Administered 2012-11-18 (×4): 0.5 mg via INTRAVENOUS

## 2012-11-18 MED ORDER — SODIUM CHLORIDE 0.9 % IV SOLN: 250.0000 mL | INTRAVENOUS | Status: DC | PRN

## 2012-11-18 MED ORDER — NEOSTIGMINE METHYLSULFATE 1 MG/ML IJ SOLN
INTRAMUSCULAR | Status: DC | PRN
  Administered 2012-11-18: 3 mg via INTRAVENOUS

## 2012-11-18 MED ORDER — MUPIROCIN 2 % EX OINT
TOPICAL_OINTMENT | CUTANEOUS | Status: AC
  Administered 2012-11-18: 1 via NASAL
  Filled 2012-11-18: qty 22

## 2012-11-18 MED ORDER — FENTANYL CITRATE 0.05 MG/ML IJ SOLN
INTRAMUSCULAR | Status: DC | PRN
  Administered 2012-11-18: 100 ug via INTRAVENOUS
  Administered 2012-11-18 (×3): 50 ug via INTRAVENOUS

## 2012-11-18 MED ORDER — PROPOFOL 10 MG/ML IV BOLUS
INTRAVENOUS | Status: DC | PRN
  Administered 2012-11-18: 50 mg via INTRAVENOUS
  Administered 2012-11-18: 150 mg via INTRAVENOUS

## 2012-11-18 MED ORDER — SODIUM CHLORIDE 0.9 % IV SOLN
INTRAVENOUS | Status: DC | PRN
  Administered 2012-11-18: 13:00:00

## 2012-11-18 MED ORDER — ONDANSETRON HCL 4 MG/2ML IJ SOLN
INTRAMUSCULAR | Status: DC | PRN
  Administered 2012-11-18: 4 mg via INTRAVENOUS

## 2012-11-18 MED ORDER — SODIUM CHLORIDE 0.9 % IJ SOLN: 3.0000 mL | INTRAMUSCULAR | Status: DC | PRN

## 2012-11-18 MED ORDER — LIDOCAINE HCL (CARDIAC) 20 MG/ML IV SOLN
INTRAVENOUS | Status: DC | PRN
  Administered 2012-11-18: 80 mg via INTRAVENOUS

## 2012-11-18 MED ORDER — MIDAZOLAM HCL 5 MG/5ML IJ SOLN
INTRAMUSCULAR | Status: DC | PRN
  Administered 2012-11-18: 2 mg via INTRAVENOUS

## 2012-11-18 MED ORDER — ACETAMINOPHEN 325 MG PO TABS: 650.0000 mg | ORAL_TABLET | ORAL | Status: DC | PRN

## 2012-11-18 MED ORDER — DEXAMETHASONE SODIUM PHOSPHATE 4 MG/ML IJ SOLN
INTRAMUSCULAR | Status: DC | PRN
  Administered 2012-11-18: 4 mg via INTRAVENOUS

## 2012-11-18 MED ORDER — MORPHINE SULFATE 2 MG/ML IJ SOLN: 2.0000 mg | INTRAMUSCULAR | Status: DC | PRN

## 2012-11-18 SURGICAL SUPPLY — 37 items
APPLIER CLIP 5 13 M/L LIGAMAX5 (MISCELLANEOUS) ×2
BLADE SURG ROTATE 9660 (MISCELLANEOUS) ×2 IMPLANT
CANISTER SUCTION 2500CC (MISCELLANEOUS) ×2 IMPLANT
CHLORAPREP W/TINT 26ML (MISCELLANEOUS) ×2 IMPLANT
CLIP APPLIE 5 13 M/L LIGAMAX5 (MISCELLANEOUS) ×1 IMPLANT
CLOTH BEACON ORANGE TIMEOUT ST (SAFETY) ×2 IMPLANT
COVER MAYO STAND STRL (DRAPES) ×2 IMPLANT
COVER SURGICAL LIGHT HANDLE (MISCELLANEOUS) ×2 IMPLANT
DECANTER SPIKE VIAL GLASS SM (MISCELLANEOUS) ×4 IMPLANT
DERMABOND ADVANCED (GAUZE/BANDAGES/DRESSINGS) ×2
DERMABOND ADVANCED .7 DNX12 (GAUZE/BANDAGES/DRESSINGS) ×2 IMPLANT
DRAPE C-ARM 42X72 X-RAY (DRAPES) ×2 IMPLANT
ELECT REM PT RETURN 9FT ADLT (ELECTROSURGICAL) ×2
ELECTRODE REM PT RTRN 9FT ADLT (ELECTROSURGICAL) ×1 IMPLANT
GLOVE BIO SURGEON STRL SZ7 (GLOVE) ×4 IMPLANT
GLOVE BIOGEL PI IND STRL 7.5 (GLOVE) ×2 IMPLANT
GLOVE BIOGEL PI IND STRL 8 (GLOVE) ×1 IMPLANT
GLOVE BIOGEL PI INDICATOR 7.5 (GLOVE) ×2
GLOVE BIOGEL PI INDICATOR 8 (GLOVE) ×1
GOWN STRL NON-REIN LRG LVL3 (GOWN DISPOSABLE) ×8 IMPLANT
KIT BASIN OR (CUSTOM PROCEDURE TRAY) ×2 IMPLANT
KIT ROOM TURNOVER OR (KITS) ×2 IMPLANT
NS IRRIG 1000ML POUR BTL (IV SOLUTION) ×2 IMPLANT
PAD ARMBOARD 7.5X6 YLW CONV (MISCELLANEOUS) ×2 IMPLANT
POUCH SPECIMEN RETRIEVAL 10MM (ENDOMECHANICALS) ×2 IMPLANT
SCISSORS LAP 5X35 DISP (ENDOMECHANICALS) ×2 IMPLANT
SET CHOLANGIOGRAPH 5 50 .035 (SET/KITS/TRAYS/PACK) ×2 IMPLANT
SET IRRIG TUBING LAPAROSCOPIC (IRRIGATION / IRRIGATOR) ×2 IMPLANT
SLEEVE ENDOPATH XCEL 5M (ENDOMECHANICALS) ×4 IMPLANT
SPECIMEN JAR SMALL (MISCELLANEOUS) ×2 IMPLANT
SUT MNCRL AB 4-0 PS2 18 (SUTURE) ×2 IMPLANT
TOWEL OR 17X24 6PK STRL BLUE (TOWEL DISPOSABLE) ×2 IMPLANT
TOWEL OR 17X26 10 PK STRL BLUE (TOWEL DISPOSABLE) ×2 IMPLANT
TOWEL OR NON WOVEN STRL DISP B (DISPOSABLE) ×2 IMPLANT
TRAY LAPAROSCOPIC (CUSTOM PROCEDURE TRAY) ×2 IMPLANT
TROCAR XCEL BLUNT TIP 100MML (ENDOMECHANICALS) ×2 IMPLANT
TROCAR XCEL NON-BLD 5MMX100MML (ENDOMECHANICALS) ×2 IMPLANT

## 2012-11-18 NOTE — Op Note (Signed)
Preoperative diagnosis: Right upper quadrant pain Postoperative diagnosis: Same as above Procedure: Laparoscopic cholecystectomy with cholangiogram Surgeon: Dr. Harden Mo Asst.: None Anesthesia: Gen. Endotracheal Specimens: Gallbladder and contents to pathology Estimated blood loss: Minimal Complications: None Drains: None Sponge needle count correct at end of operation Disposition to recovery stable  Indications: This is a 21 year old male with history as documented in my prior notes. He has a history of right upper quadrant pain with no real objective evidence of gallbladder disease. He has been seen by Dr Dulce Sellar as well as myself. He continues to have a lot of right upper quadrant pain I discussed with he and his family the risks and benefits of a cholecystectomy. Specifically I told them that there was a decent chance we may not help his pain with a cholecystectomy. He is willing to proceed with this understanding these risks after a long conversation.  Procedure: After informed consent was obtained from the patient he was taken to the operating room. He was administered cefoxitin. Sequential compression devices were on his legs. He was then placed under general endotracheal anesthesia without complication. His abdomen was prepped and draped in the standard sterile surgical fashion. A surgical timeout was then performed.  I infiltrated Marcaine below his umbilicus. I then made a vertical incision and carried this to his fascia. Fascia was entered sharply and the peritoneum was entered bluntly. I placed a 0 Vicryl pursestring suture through the fascia. I then inserted a Hassan trocar and insufflated the abdomen to 15 mm mercury pressure. I then inserted 3 further 5 mm trocars in the epigastrium and right side of the abdomen. I then retracted the gallbladder cephalad. There were some adhesions from his gallbladder to the omentum that were taken down bluntly. I then retracted the gallbladder  cephalad and lateral. There was some scarring in this triangle. I eventually was able to obtain the critical view of safety. I then clipped the duct distally. I made a ductotomy and introduced a Cook catheter. I did a cholangiogram and it showed that I was in the cystic duct, no filling defects, flow into the duodenum, and flow into both sides of his liver. There did not appear to be any abnormalities on his cholangiogram to me. I then removed the catheter and clipped the duct and divided it. I clipped the anterior and the posterior branch of the cystic artery in a similar fashion. I then removed the gallbladder from the liver bed without difficulty. I placed this in an Endo Catch bag and removed from the umbilicus. Hemostasis was then obtained. Irrigation was performed until this was clear. I then removed my umbilical trocar time and tied my stitch down and this completely obliterated the defect. There was no evidence of an entry injury. I then desufflated the abdomen and removed my remaining trocars. These were closed with 4-0 Monocryl and Dermabond. Steri-Strips are placed over this. He tolerated this well was extubated and transferred to recovery stable.

## 2012-11-18 NOTE — Interval H&P Note (Signed)
History and Physical Interval Note:  11/18/2012 12:37 PM Apparently he has been diagnosed with eosinophilic esophagitis that I was unaware of.  I have discussed with Dr. Dulce Sellar and will proceed with lap chole today. Terry Moreno ZOXWR  has presented today for surgery, with the diagnosis of RUQ Pain  The various methods of treatment have been discussed with the patient and family. After consideration of risks, benefits and other options for treatment, the patient has consented to  Procedure(s) (LRB) with comments: LAPAROSCOPIC CHOLECYSTECTOMY WITH INTRAOPERATIVE CHOLANGIOGRAM (N/A) as a surgical intervention .  The patient's history has been reviewed, patient examined, no change in status, stable for surgery.  I have reviewed the patient's chart and labs.  Questions were answered to the patient's satisfaction.     [name]

## 2012-11-18 NOTE — H&P (View-Only) (Signed)
Subjective:     Patient ID: Terry Moreno, male   DOB: 1991-03-29, 21 y.o.   MRN: [id number]  HPI  This is a 21 year old male whose history is documented in my prior note. Since our last visit he is obtained a second opinion from Dr. Dulce Sellar. He actually has done an endoscopic ultrasound that is essentially normal. There were some subtle changes of the pancreas that I certainly don't think are causing any of his pain. I've spoken to Dr. Dulce Sellar about his case who thinks cholecystectomy is reasonable and I had the patient returned today. In summary he now still has really no changes in his symptoms. They continued the same. He has basically all normal tests with some atypical symptoms as well as some symptoms that might be referable to his gallbladder. He and his mom returned today for a conversation about possible treatment options at this point. Review of Systems     Objective:   Physical Exam    deferred Assessment:    RUQ pain    Plan:     We discussed the options including continued evaluation for medical issues for his pain is is being done by his primary care physician. We discussed the option of a cholecystectomy. I discussed this with he and his lmom going through all of his negative tests. I told him the success of an operation to relieve his symptoms is less than 50% of the time. I told him specifically that we may do surgery and this may not relieve any of the symptoms that he has. Both he and his mother voiced their understanding to this. I don't think there is really any other evaluation due for his symptoms right now. Certainly some of these could be related to his gallbladder. I don't think there is any other tests to determine this at this point outside of just proceeding and doing a laparoscopic cholecystectomy. We had a long talk again about the fact that this may not cure any of his symptoms and that he would need further evaluation if that were the case. I discussed the short-term  risks of gallbladder surgery including bleeding, infection, further procedures, open surgery, common bile duct injury as well as an op helping his symptoms. I discussed the long-term issues with diarrhea as well as a change in his diet he may need occurs well. Both he and his mother agreed that this is a reasonable next And again voiced their understanding that this may not help his symptoms. He is so miserable right now that he is willing to try this.

## 2012-11-18 NOTE — Preoperative (Signed)
Beta Blockers   Reason not to administer Beta Blockers:Not Applicable 

## 2012-11-18 NOTE — Transfer of Care (Signed)
Immediate Anesthesia Transfer of Care Note  Patient: Terry Moreno  Procedure(s) Performed: Procedure(s) (LRB) with comments: LAPAROSCOPIC CHOLECYSTECTOMY WITH INTRAOPERATIVE CHOLANGIOGRAM (N/A)  Patient Location: PACU  Anesthesia Type:General  Level of Consciousness: awake, alert  and oriented  Airway & Oxygen Therapy: Patient Spontanous Breathing and Patient connected to nasal cannula oxygen  Post-op Assessment: Report given to PACU RN, Post -op Vital signs reviewed and stable and Patient moving all extremities X 4  Post vital signs: Reviewed and stable  Complications: No apparent anesthesia complications

## 2012-11-18 NOTE — Anesthesia Preprocedure Evaluation (Addendum)
Anesthesia Evaluation  Patient identified by MRN, date of birth, ID band Patient awake    Reviewed: Allergy & Precautions, H&P , NPO status , Patient's Chart, lab work & pertinent test results  Airway       Dental  (+) Dental Advisory Given   Pulmonary          Cardiovascular     Neuro/Psych PSYCHIATRIC DISORDERS Anxiety    GI/Hepatic GERD-  ,(+)     substance abuse  marijuana use,   Endo/Other    Renal/GU      Musculoskeletal   Abdominal   Peds  Hematology   Anesthesia Other Findings   Reproductive/Obstetrics                          Anesthesia Physical Anesthesia Plan  ASA: II  Anesthesia Plan: General   Post-op Pain Management:    Induction: Intravenous  Airway Management Planned: Oral ETT  Additional Equipment:   Intra-op Plan:   Post-operative Plan: Extubation in OR  Informed Consent: I have reviewed the patients History and Physical, chart, labs and discussed the procedure including the risks, benefits and alternatives for the proposed anesthesia with the patient or authorized representative who has indicated his/her understanding and acceptance.   Dental advisory given  Plan Discussed with: Anesthesiologist and Surgeon  Anesthesia Plan Comments:         Anesthesia Quick Evaluation

## 2012-11-18 NOTE — Anesthesia Procedure Notes (Signed)
Procedure Name: Intubation Date/Time: 11/18/2012 12:55 PM Performed by: Elon Alas Pre-anesthesia Checklist: Patient identified, Timeout performed, Emergency Drugs available, Suction available and Patient being monitored Patient Re-evaluated:Patient Re-evaluated prior to inductionOxygen Delivery Method: Circle system utilized Preoxygenation: Pre-oxygenation with 100% oxygen Intubation Type: IV induction Ventilation: Mask ventilation without difficulty Laryngoscope Size: 4 and Mac Grade View: Grade I Tube type: Oral Tube size: 7.5 mm Number of attempts: 1 Airway Equipment and Method: Stylet Placement Confirmation: positive ETCO2,  ETT inserted through vocal cords under direct vision and breath sounds checked- equal and bilateral Secured at: 23 cm Tube secured with: Tape Dental Injury: Teeth and Oropharynx as per pre-operative assessment

## 2012-11-18 NOTE — Anesthesia Postprocedure Evaluation (Signed)
  Anesthesia Post-op Note  Patient: Terry Moreno [id number]  Procedure(s) Performed: Procedure(s) (LRB) with comments: LAPAROSCOPIC CHOLECYSTECTOMY WITH INTRAOPERATIVE CHOLANGIOGRAM (N/A)  Patient Location: PACU  Anesthesia Type:General  Level of Consciousness: awake  Airway and Oxygen Therapy: Patient Spontanous Breathing  Post-op Pain: mild  Post-op Assessment: Post-op Vital signs reviewed  Post-op Vital Signs: Reviewed  Complications: No apparent anesthesia complications

## 2012-11-21 ENCOUNTER — Encounter (HOSPITAL_COMMUNITY): Payer: Self-pay | Admitting: General Surgery

## 2012-12-02 ENCOUNTER — Ambulatory Visit (INDEPENDENT_AMBULATORY_CARE_PROVIDER_SITE_OTHER): Payer: BC Managed Care – PPO | Admitting: General Surgery

## 2012-12-02 ENCOUNTER — Encounter (INDEPENDENT_AMBULATORY_CARE_PROVIDER_SITE_OTHER): Payer: Self-pay | Admitting: General Surgery

## 2012-12-02 VITALS — BP 118/60 | HR 70 | Temp 98.3°F | Resp 16 | Ht 72.0 in | Wt 137.4 lb

## 2012-12-02 DIAGNOSIS — Z09 Encounter for follow-up examination after completed treatment for conditions other than malignant neoplasm: Secondary | ICD-10-CM

## 2012-12-03 NOTE — Progress Notes (Signed)
Subjective:     Patient ID: Terry Moreno, male   DOB: 30-May-1991, 22 y.o.   MRN: [id number]  HPI 53 yom who had multiple symptoms and has new diagnosis of eoe.  We had long discussion and ended up proceeding with lap chole.  I did cholangiogram also.  He had some scarring around gb.  Path shows chronic cholecystitis.  He had no stones. Understood this might not help. He returns today postop feeling about 20-30% better than preop.  He has some symptoms of indigestion and eructation but ruq pain is improved and he actually has an appetite now also.  He is happy and is hopefully going to continue to improve.  He is not taking PPI now but has a sample for 30 days of dexilant.  He is going to start taking that now also.   Review of Systems     Objective:   Physical Exam    healing incisions without infection Assessment:     S/p lap chole    Plan:     I told him he can drive and slowly increase activity.  I think his final outcome will depend on treatment of EOE as well as take another 1-2 months.  I told him we would follow up as needed and asked him to call if he has any questions about his progress

## 2013-01-14 ENCOUNTER — Other Ambulatory Visit: Payer: Self-pay

## 2013-02-17 ENCOUNTER — Other Ambulatory Visit: Payer: Self-pay

## 2013-10-05 ENCOUNTER — Other Ambulatory Visit: Payer: Self-pay

## 2014-06-03 IMAGING — NM NM BOWEL IMG MECKELS
1 series · 6 of 6 positions shown · non-contrast
Comparison: Hepatobiliary scan from 10/10/2012.  CT scan from
07/16/2012

CLINICAL DATA: Right upper quadrant abdominal pain.

NUCLEAR MEDICINE MECKELS SCAN
TECHNIQUE: Sequential abdominal images were obtained following
intravenous injection of radiopharmaceutical.
Radiopharmaceutical: QQAYHHY IJBEN SSmOcMV SODIUM PERTECHNETATE TC
99M INJECTION

[Series 1: meckels · 4.46mm/px · 6 of 60 frames shown]
[frame 6/60]
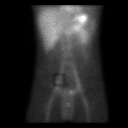
[frame 16/60]
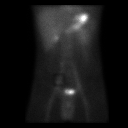
[frame 26/60]
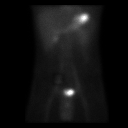
[frame 36/60]
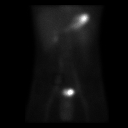
[frame 46/60]
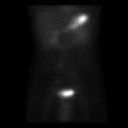
[frame 56/60]
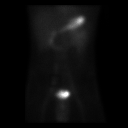

[6 of 6 positions shown; findings below may reference images not displayed]

FINDINGS: Planar are imaging shows good blood pool activity with
excretion radiotracer by the gastric mucosa visible at 5 - 10
minutes.  Imaging was carried [DATE] minutes and there is no
finding to suggest the presence of ectopic gastric mucosa in the
abdomen or pelvis as would be expected in a Meckel's diverticulum.
IMPRESSION: No scintigraphic evidence for Meckel's diverticulum.

## 2017-05-12 DIAGNOSIS — K200 Eosinophilic esophagitis: Secondary | ICD-10-CM | POA: Diagnosis not present

## 2017-05-12 DIAGNOSIS — J302 Other seasonal allergic rhinitis: Secondary | ICD-10-CM | POA: Diagnosis not present

## 2017-05-12 DIAGNOSIS — Z79899 Other long term (current) drug therapy: Secondary | ICD-10-CM | POA: Diagnosis not present

## 2017-05-12 DIAGNOSIS — R10816 Epigastric abdominal tenderness: Secondary | ICD-10-CM | POA: Diagnosis not present

## 2017-05-12 DIAGNOSIS — F419 Anxiety disorder, unspecified: Secondary | ICD-10-CM | POA: Diagnosis not present

## 2017-05-12 DIAGNOSIS — Z906 Acquired absence of other parts of urinary tract: Secondary | ICD-10-CM | POA: Diagnosis not present

## 2017-05-12 DIAGNOSIS — R1310 Dysphagia, unspecified: Secondary | ICD-10-CM | POA: Diagnosis not present

## 2017-05-12 DIAGNOSIS — H1010 Acute atopic conjunctivitis, unspecified eye: Secondary | ICD-10-CM | POA: Diagnosis not present

## 2017-05-12 DIAGNOSIS — R0789 Other chest pain: Secondary | ICD-10-CM | POA: Diagnosis not present

## 2017-05-12 DIAGNOSIS — Z6833 Body mass index (BMI) 33.0-33.9, adult: Secondary | ICD-10-CM | POA: Diagnosis not present

## 2017-06-16 DIAGNOSIS — Z6825 Body mass index (BMI) 25.0-25.9, adult: Secondary | ICD-10-CM | POA: Diagnosis not present

## 2017-06-16 DIAGNOSIS — R0789 Other chest pain: Secondary | ICD-10-CM | POA: Diagnosis not present

## 2017-06-30 DIAGNOSIS — R0781 Pleurodynia: Secondary | ICD-10-CM | POA: Diagnosis not present

## 2017-07-22 DIAGNOSIS — R1319 Other dysphagia: Secondary | ICD-10-CM | POA: Diagnosis not present

## 2017-07-22 DIAGNOSIS — R1310 Dysphagia, unspecified: Secondary | ICD-10-CM | POA: Diagnosis not present

## 2017-07-22 DIAGNOSIS — K200 Eosinophilic esophagitis: Secondary | ICD-10-CM | POA: Diagnosis not present

## 2017-07-22 DIAGNOSIS — K219 Gastro-esophageal reflux disease without esophagitis: Secondary | ICD-10-CM | POA: Diagnosis not present

## 2017-07-22 DIAGNOSIS — Z79899 Other long term (current) drug therapy: Secondary | ICD-10-CM | POA: Diagnosis not present

## 2017-07-22 DIAGNOSIS — R079 Chest pain, unspecified: Secondary | ICD-10-CM | POA: Diagnosis not present

## 2017-07-22 DIAGNOSIS — R0789 Other chest pain: Secondary | ICD-10-CM | POA: Diagnosis not present

## 2017-07-22 DIAGNOSIS — F17200 Nicotine dependence, unspecified, uncomplicated: Secondary | ICD-10-CM | POA: Diagnosis not present

## 2017-09-06 DIAGNOSIS — M549 Dorsalgia, unspecified: Secondary | ICD-10-CM | POA: Diagnosis not present

## 2017-09-06 DIAGNOSIS — N3090 Cystitis, unspecified without hematuria: Secondary | ICD-10-CM | POA: Diagnosis not present

## 2017-09-09 DIAGNOSIS — R300 Dysuria: Secondary | ICD-10-CM | POA: Diagnosis not present

## 2017-09-10 ENCOUNTER — Emergency Department (HOSPITAL_BASED_OUTPATIENT_CLINIC_OR_DEPARTMENT_OTHER)
Admission: EM | Admit: 2017-09-10 | Discharge: 2017-09-10 | Disposition: A | Discharge status: Home / Self Care | Payer: BLUE CROSS/BLUE SHIELD | Attending: Emergency Medicine | Admitting: Emergency Medicine

## 2017-09-10 ENCOUNTER — Emergency Department (HOSPITAL_BASED_OUTPATIENT_CLINIC_OR_DEPARTMENT_OTHER): Payer: BLUE CROSS/BLUE SHIELD

## 2017-09-10 ENCOUNTER — Encounter (HOSPITAL_BASED_OUTPATIENT_CLINIC_OR_DEPARTMENT_OTHER): Payer: Self-pay | Admitting: *Deleted

## 2017-09-10 DIAGNOSIS — Z87891 Personal history of nicotine dependence: Secondary | ICD-10-CM | POA: Diagnosis not present

## 2017-09-10 DIAGNOSIS — Z79899 Other long term (current) drug therapy: Secondary | ICD-10-CM | POA: Diagnosis not present

## 2017-09-10 DIAGNOSIS — N50812 Left testicular pain: Secondary | ICD-10-CM | POA: Diagnosis not present

## 2017-09-10 DIAGNOSIS — N390 Urinary tract infection, site not specified: Secondary | ICD-10-CM | POA: Diagnosis not present

## 2017-09-10 DIAGNOSIS — R350 Frequency of micturition: Secondary | ICD-10-CM | POA: Diagnosis not present

## 2017-09-10 DIAGNOSIS — M549 Dorsalgia, unspecified: Secondary | ICD-10-CM | POA: Insufficient documentation

## 2017-09-10 DIAGNOSIS — R110 Nausea: Secondary | ICD-10-CM | POA: Insufficient documentation

## 2017-09-10 DIAGNOSIS — R1030 Lower abdominal pain, unspecified: Secondary | ICD-10-CM | POA: Diagnosis not present

## 2017-09-10 HISTORY — DX: Eosinophilic esophagitis: K20.0

## 2017-09-10 LAB — URINALYSIS, MICROSCOPIC (REFLEX)

## 2017-09-10 LAB — URINALYSIS, ROUTINE W REFLEX MICROSCOPIC
Bilirubin Urine: NEGATIVE
GLUCOSE, UA: NEGATIVE mg/dL
KETONES UR: 15 mg/dL — AB
LEUKOCYTES UA: NEGATIVE
Nitrite: POSITIVE — AB
PROTEIN: NEGATIVE mg/dL
Specific Gravity, Urine: >1.03 — ABNORMAL HIGH (ref 1.005–1.030)
pH: 6 (ref 5.0–8.0)

## 2017-09-10 MED ORDER — CEPHALEXIN 500 MG PO CAPS: 500.0000 mg | ORAL_CAPSULE | Freq: Three times a day (TID) | ORAL | 0 refills | Status: AC

## 2017-09-10 MED ORDER — KETOROLAC TROMETHAMINE 60 MG/2ML IM SOLN
60.0000 mg | Freq: Once | INTRAMUSCULAR | Status: AC
  Administered 2017-09-10: 60 mg via INTRAMUSCULAR
  Filled 2017-09-10: qty 2

## 2017-09-10 MED ORDER — ONDANSETRON 4 MG PO TBDP: 4.0000 mg | ORAL_TABLET | Freq: Three times a day (TID) | ORAL | 0 refills | Status: AC | PRN

## 2017-09-10 MED ORDER — ONDANSETRON 4 MG PO TBDP
4.0000 mg | ORAL_TABLET | Freq: Once | ORAL | Status: AC
  Administered 2017-09-10: 4 mg via ORAL
  Filled 2017-09-10: qty 1

## 2017-09-10 NOTE — ED Triage Notes (Addendum)
Pt c/o left scrotum pain x 5 days also c/o back pain, sen by PMD x 2 this week for same

## 2017-09-10 NOTE — ED Provider Notes (Signed)
[facility]   CSN: [id number] Arrival date & time: 09/10/17  1258     History   Chief Complaint Chief Complaint  Patient presents with  . Groin Pain    HPI Terry Moreno is a 26 y.o. male.  HPI  26 year old male presents with back pain, suprapubic pain, and left scrotal pain. This back pain has been going on for about a week or week and a half. It is mostly left-sided. It seems to be constant. Over the last couple days he's noticed trouble urinating and he is frequently urinating small amounts. It does hurt to urinate. He states his urine looks more orange recently. There is no penile discharge. He has pain at the distal aspect of his penis as well as on the skin of his scrotum near his left testicle. He denies any recent intercourse or unprotected sex. States he went to a nurse practitioner couple days ago and was given Flomax and some other medicine that made him nauseated. Otherwise he has not taken anything for the symptoms. No fevers. He's felt somewhat nauseated but no vomiting.  Past Medical History:  Diagnosis Date  . Acid reflux    eosinophilic esophagitis   . Anxiety   . Deviated septum   . EE (eosinophilic esophagitis)   . Seasonal allergies     Patient Active Problem List   Diagnosis Date Noted  . GAD (generalized anxiety disorder) 12/07/2011    Past Surgical History:  Procedure Laterality Date  . CHOLECYSTECTOMY  11/18/2012   Procedure: LAPAROSCOPIC CHOLECYSTECTOMY WITH INTRAOPERATIVE CHOLANGIOGRAM;  Surgeon: Emelia Loron, MD;  Location: [facility];  Service: General;  Laterality: N/A;  . ESOPHAGOGASTRODUODENOSCOPY (EGD) WITH PROPOFOL  11/07/2012   Procedure: ESOPHAGOGASTRODUODENOSCOPY (EGD) WITH PROPOFOL;  Surgeon: Willis Modena, MD;  Location: [facility];  Service: Endoscopy;  Laterality: N/A;  . EUS  11/07/2012   Procedure: ESOPHAGEAL ENDOSCOPIC ULTRASOUND (EUS) RADIAL;  Surgeon: Willis Modena, MD;  Location: [facility];   Service: Endoscopy;  Laterality: N/A;  . TYMPANOSTOMY TUBE PLACEMENT     in the past  . WISDOM TOOTH EXTRACTION  2008       Home Medications    Prior to Admission medications   Medication Sig Start Date End Date Taking? Authorizing Provider  budesonide (PULMICORT) 0.5 MG/2ML nebulizer solution Take 0.5 mg by nebulization 2 (two) times daily.   Yes [provider]  venlafaxine XR (EFFEXOR-XR) 150 MG 24 hr capsule Take 150 mg by mouth daily with breakfast.   Yes [provider]  cephALEXin (KEFLEX) 500 MG capsule Take 1 capsule (500 mg total) by mouth 3 (three) times daily. 09/10/17 09/20/17  Pricilla Loveless, MD  cetirizine (ZYRTEC) 10 MG tablet Take 10 mg by mouth daily as needed. For allergies    [provider]  diphenhydrAMINE (BENADRYL) 25 mg capsule Take 25 mg by mouth every 6 (six) hours as needed. For itching    [provider]  fluticasone (FLOVENT HFA) 220 MCG/ACT inhaler Take 2 puffs by mouth 2 (two) times daily. Pt swallows medication rather than inhale    [provider]  Melatonin 10 MG CAPS Take 1 capsule by mouth at bedtime as needed. Sleep    [provider]  ondansetron (ZOFRAN ODT) 4 MG disintegrating tablet Take 1 tablet (4 mg total) by mouth every 8 (eight) hours as needed for nausea or vomiting. 09/10/17   Pricilla Loveless, MD    Family History Family History  Problem Relation Age of Onset  .  Anxiety disorder Mother   . Anxiety disorder Father   . Depression Father   . Cancer Maternal Grandfather        lukemia  . Cancer Paternal Grandfather        pancreatic    Social History Social History  Substance Use Topics  . Smoking status: Former Smoker    Quit date: 02/29/2012  . Smokeless tobacco: Never Used  . Alcohol use 0.6 oz/week    1 Cans of beer per week     Comment: Has stopped because of current complaint.     Allergies   Patient has no known allergies.   Review of Systems Review of Systems    Constitutional: Negative for fever.  Gastrointestinal: Positive for abdominal pain and nausea. Negative for vomiting.  Genitourinary: Positive for dysuria, frequency, penile pain and testicular pain. Negative for scrotal swelling.  Musculoskeletal: Positive for back pain.  All other systems reviewed and are negative.    Physical Exam Updated Vital Signs BP 115/68 (BP Location: Right Arm)   Pulse 76   Temp 98.5 F (36.9 C) (Oral)   Resp 16   Ht  (1.854 m)   Wt 86.2 kg (190 lb)   SpO2 99%   BMI 25.07 kg/m   Physical Exam  Constitutional: He is oriented to person, place, and time. He appears well-developed and well-nourished. No distress.  HENT:  Head: Normocephalic and atraumatic.  Right Ear: External ear normal.  Left Ear: External ear normal.  Nose: Nose normal.  Eyes: Right eye exhibits no discharge. Left eye exhibits no discharge.  Neck: Neck supple.  Pulmonary/Chest: Effort normal.  Abdominal: Soft. There is tenderness. There is CVA tenderness (left sided).    Genitourinary: Penis normal. Right testis shows no tenderness. Left testis shows no tenderness. Circumcised. No penile erythema or penile tenderness. No discharge found.  Genitourinary Comments: There is some mild tenderness inferior to the left testicle but no skin changes noted. However he doesn't seem to think the testicle is tender when palpated  Musculoskeletal: He exhibits no edema.  Neurological: He is alert and oriented to person, place, and time.  Skin: Skin is warm and dry. He is not diaphoretic.  Nursing note and vitals reviewed.    ED Treatments / Results  Labs (all labs ordered are listed, but only abnormal results are displayed) Labs Reviewed  URINALYSIS, ROUTINE W REFLEX MICROSCOPIC - Abnormal; Notable for the following:       Result Value   Specific Gravity, Urine >1.030 (*)    Hgb urine dipstick TRACE (*)    Ketones, ur 15 (*)    Nitrite POSITIVE (*)    All other components within  normal limits  URINALYSIS, MICROSCOPIC (REFLEX) - Abnormal; Notable for the following:    Bacteria, UA RARE (*)    Squamous Epithelial / LPF 0-5 (*)    All other components within normal limits  URINE CULTURE  GC/CHLAMYDIA PROBE AMP ([place]) NOT AT [facility]    EKG  EKG Interpretation None       Radiology US Scrotum  Result Date: 09/10/2017 CLINICAL DATA:  Low back pain with radiation to the testicles, left more than right. EXAM: SCROTAL ULTRASOUND DOPPLER ULTRASOUND OF THE TESTICLES TECHNIQUE: Complete ultrasound examination of the testicles, epididymis, and other scrotal structures was performed. Color and spectral Doppler ultrasound were also utilized to evaluate blood flow to the testicles. COMPARISON:  None. FINDINGS: Right testicle Measurements: 4.8 x 2.0 x 3.3 cm. No mass or microlithiasis  visualized. Left testicle Measurements: 4.7 x 2.3 x 3.2 cm. No mass or microlithiasis visualized. Right epididymis: Normal in size and appearance. Incidental note of 3 mm benign-appearing cyst. Left epididymis:  Normal in size and appearance. Hydrocele:  None visualized. Varicocele:  None visualized. Pulsed Doppler interrogation of both testes demonstrates normal low resistance arterial and venous waveforms bilaterally. IMPRESSION: Normal testicular ultrasound. Electronically Signed   By: Ted Mcalpine M.D.   On: 09/10/2017 17:21   US Renal  Result Date: 09/10/2017 CLINICAL DATA:  Low back pain, left greater than right, radiating to the left testicle for 1 and half weeks. EXAM: RENAL / URINARY TRACT ULTRASOUND COMPLETE COMPARISON:  None. FINDINGS: Right Kidney: Length: 10.9 cm. Echogenicity within normal limits. No mass or hydronephrosis visualized. Left Kidney: Length: 10.8 cm. Echogenicity within normal limits. No mass or hydronephrosis visualized. Bladder: Decompressed and therefore poorly evaluated. IMPRESSION: Normal appearance of bilateral kidneys. Urinary bladder poorly visualized due  to its decompressed state. Electronically Signed   By: Ted Mcalpine M.D.   On: 09/10/2017 17:22   [place] Art/ven Flow Abd Pelv Doppler  Result Date: 09/10/2017 CLINICAL DATA:  Low back pain with radiation to the testicles, left more than right. EXAM: SCROTAL ULTRASOUND DOPPLER ULTRASOUND OF THE TESTICLES TECHNIQUE: Complete ultrasound examination of the testicles, epididymis, and other scrotal structures was performed. Color and spectral Doppler ultrasound were also utilized to evaluate blood flow to the testicles. COMPARISON:  None. FINDINGS: Right testicle Measurements: 4.8 x 2.0 x 3.3 cm. No mass or microlithiasis visualized. Left testicle Measurements: 4.7 x 2.3 x 3.2 cm. No mass or microlithiasis visualized. Right epididymis: Normal in size and appearance. Incidental note of 3 mm benign-appearing cyst. Left epididymis:  Normal in size and appearance. Hydrocele:  None visualized. Varicocele:  None visualized. Pulsed Doppler interrogation of both testes demonstrates normal low resistance arterial and venous waveforms bilaterally. IMPRESSION: Normal testicular ultrasound. Electronically Signed   By: Ted Mcalpine M.D.   On: 09/10/2017 17:21    Procedures Procedures (including critical care time)  Medications Ordered in ED Medications  ketorolac (TORADOL) injection 60 mg (60 mg Intramuscular Given 09/10/17 1712)  ondansetron (ZOFRAN-ODT) disintegrating tablet 4 mg (4 mg Oral Given 09/10/17 1712)     Initial Impression / Assessment and Plan / ED Course  I have reviewed the triage vital signs and the nursing notes.  Pertinent labs & imaging results that were available during my care of the patient were reviewed by me and considered in my medical decision making (see chart for details).     Unclear exact cause of the patient's symptoms. His testicles are not specifically tender but I think epididymitis is less likely. He stood [place] states he has not had any recent intercourse or  unprotected intercourse. Having STI is less likely but will still evaluate for with a urine sample. His ultrasound of his testicles/scrotum is unremarkable. Ultrasound of his renal system is also unremarkable. He does have unilateral flank pain but with no hydronephrosis my suspicion of a ureteral or kidney stone is low. With his urgency and frequency and dysuria is mostly sounds like a UTI. He has positive nitrites although upon further questioning his mom states he's on something like Pyridium and so this could give a false positive nitrates. However otherwise I think it is reasonable to start treating him for urinary tract infection. Given his nausea, will treat with Zofran and cover for pyelonephritis. However he otherwise appears quite well. Refer to urology. Discussed return precautions.  Final Clinical Impressions(s) / ED Diagnoses   Final diagnoses:  Acute urinary tract infection    New Prescriptions Discharge Medication List as of 09/10/2017  5:45 PM    START taking these medications   Details  cephALEXin (KEFLEX) 500 MG capsule Take 1 capsule (500 mg total) by mouth 3 (three) times daily., Starting Fri 09/10/2017, Until Mon 09/20/2017, Print         Pricilla Loveless, MD 09/10/17 Rickey Primus

## 2017-09-11 LAB — URINE CULTURE: Culture: NO GROWTH

## 2017-09-13 LAB — GC/CHLAMYDIA PROBE AMP (~~LOC~~) NOT AT ARMC
Chlamydia: NEGATIVE
Neisseria Gonorrhea: NEGATIVE

## 2017-10-12 DIAGNOSIS — R3912 Poor urinary stream: Secondary | ICD-10-CM | POA: Diagnosis not present

## 2017-10-12 DIAGNOSIS — R102 Pelvic and perineal pain: Secondary | ICD-10-CM | POA: Diagnosis not present

## 2017-10-30 ENCOUNTER — Emergency Department (HOSPITAL_BASED_OUTPATIENT_CLINIC_OR_DEPARTMENT_OTHER): Payer: BLUE CROSS/BLUE SHIELD

## 2017-10-30 ENCOUNTER — Emergency Department (HOSPITAL_BASED_OUTPATIENT_CLINIC_OR_DEPARTMENT_OTHER)
Admission: EM | Admit: 2017-10-30 | Discharge: 2017-10-30 | Disposition: A | Discharge status: Home / Self Care | Payer: BLUE CROSS/BLUE SHIELD | Attending: Emergency Medicine | Admitting: Emergency Medicine

## 2017-10-30 ENCOUNTER — Other Ambulatory Visit: Payer: Self-pay

## 2017-10-30 ENCOUNTER — Encounter (HOSPITAL_BASED_OUTPATIENT_CLINIC_OR_DEPARTMENT_OTHER): Payer: Self-pay

## 2017-10-30 DIAGNOSIS — R109 Unspecified abdominal pain: Secondary | ICD-10-CM | POA: Diagnosis not present

## 2017-10-30 DIAGNOSIS — Z79899 Other long term (current) drug therapy: Secondary | ICD-10-CM | POA: Diagnosis not present

## 2017-10-30 DIAGNOSIS — K5909 Other constipation: Secondary | ICD-10-CM | POA: Diagnosis not present

## 2017-10-30 DIAGNOSIS — R1084 Generalized abdominal pain: Secondary | ICD-10-CM | POA: Diagnosis not present

## 2017-10-30 DIAGNOSIS — Z87891 Personal history of nicotine dependence: Secondary | ICD-10-CM | POA: Insufficient documentation

## 2017-10-30 LAB — COMPREHENSIVE METABOLIC PANEL
ALT: 35 U/L (ref 17–63)
ANION GAP: 7 (ref 5–15)
AST: 30 U/L (ref 15–41)
Albumin: 4.7 g/dL (ref 3.5–5.0)
Alkaline Phosphatase: 59 U/L (ref 38–126)
BUN: 10 mg/dL (ref 6–20)
CALCIUM: 9.2 mg/dL (ref 8.9–10.3)
CHLORIDE: 103 mmol/L (ref 101–111)
CO2: 25 mmol/L (ref 22–32)
CREATININE: 1.2 mg/dL (ref 0.61–1.24)
GLUCOSE: 101 mg/dL — AB (ref 65–99)
Potassium: 4 mmol/L (ref 3.5–5.1)
Sodium: 135 mmol/L (ref 135–145)
TOTAL PROTEIN: 7.6 g/dL (ref 6.5–8.1)
Total Bilirubin: 0.8 mg/dL (ref 0.3–1.2)

## 2017-10-30 LAB — CBC
HCT: 42.3 % (ref 39.0–52.0)
Hemoglobin: 14.8 g/dL (ref 13.0–17.0)
MCH: 32 pg (ref 26.0–34.0)
MCHC: 35 g/dL (ref 30.0–36.0)
MCV: 91.4 fL (ref 78.0–100.0)
PLATELETS: 345 10*3/uL (ref 150–400)
RBC: 4.63 MIL/uL (ref 4.22–5.81)
RDW: 13 % (ref 11.5–15.5)
WBC: 5.1 10*3/uL (ref 4.0–10.5)

## 2017-10-30 MED ORDER — IOPAMIDOL (ISOVUE-300) INJECTION 61%
100.0000 mL | Freq: Once | INTRAVENOUS | Status: AC | PRN
  Administered 2017-10-30: 100 mL via INTRAVENOUS

## 2017-10-30 NOTE — ED Triage Notes (Addendum)
Pt reports generalized abdominal discomfort for one week that radiates to groin. NO BM in 1 week. Associated nausea. States constipated for 2 weeks. He has failed treated with Dulcolax, Stool Softners and an Enema. PCP sent to ED for CT Scan. Denies opioid use. Reports recently prescribed Bactrim for bladder irritation.

## 2017-10-30 NOTE — Discharge Instructions (Signed)
It was our pleasure to provide your ER care today - we hope that you feel better.  Drink plenty of fluids. Get adequate fiber in diet.   Take colace (stool softener) 2x/day, and take miralax (laxative) once a day, as need, for constipation.  Follow up with your doctor/GI doctor in the next 1-2 weeks.

## 2017-10-30 NOTE — ED Provider Notes (Signed)
[facility] EMERGENCY DEPARTMENT Provider Note   CSN: [id number] Arrival date & time: 10/30/17  1325     History   Chief Complaint Chief Complaint  Patient presents with  . Abdominal Pain    HPI Terry Moreno is a 26 y.o. male.  Patient c/o abdominal pain in the past week. Diffuse, cramping, dull, moderate pain. Also has been new constipated in past week, having small bm 2 days ago (states normally has normal bm q 1-2 days).  Feels full/bloated. Denies vomiting. No fever or chills. States talked to his doctor and was told to go to ER for CT scan.  Patient denies dysuria or hematuria. No back or flank pain. Only prior abd surgery is remote hx cholecystectomy (not that prior u/s neg for gallstones/and hepatobiliary scan unremarkable).  Pt also notes history eosinophilic esophagitis although notes endoscopy/biopsy from this past summer was normal.    The history is provided by the patient.  Abdominal Pain   Pertinent negatives include fever and headaches.    Past Medical History:  Diagnosis Date  . Acid reflux    eosinophilic esophagitis   . Anxiety   . Deviated septum   . EE (eosinophilic esophagitis)   . Seasonal allergies     Patient Active Problem List   Diagnosis Date Noted  . GAD (generalized anxiety disorder) 12/07/2011    Past Surgical History:  Procedure Laterality Date  . CHOLECYSTECTOMY  11/18/2012   Procedure: LAPAROSCOPIC CHOLECYSTECTOMY WITH INTRAOPERATIVE CHOLANGIOGRAM;  Surgeon: Emelia Loron, MD;  Location: [facility] OR;  Service: General;  Laterality: N/A;  . ESOPHAGOGASTRODUODENOSCOPY (EGD) WITH PROPOFOL  11/07/2012   Procedure: ESOPHAGOGASTRODUODENOSCOPY (EGD) WITH PROPOFOL;  Surgeon: Willis Modena, MD;  Location: [facility];  Service: Endoscopy;  Laterality: N/A;  . EUS  11/07/2012   Procedure: ESOPHAGEAL ENDOSCOPIC ULTRASOUND (EUS) RADIAL;  Surgeon: Willis Modena, MD;  Location: [facility];  Service: Endoscopy;  Laterality: N/A;  .  TYMPANOSTOMY TUBE PLACEMENT     in the past  . WISDOM TOOTH EXTRACTION  2008       Home Medications    Prior to Admission medications   Medication Sig Start Date End Date Taking? Authorizing Provider  budesonide (ENTOCORT EC) 3 MG 24 hr capsule Take by mouth daily.   Yes [provider]  cetirizine (ZYRTEC) 10 MG tablet Take 10 mg by mouth daily as needed. For allergies   Yes [provider]  diphenhydrAMINE (BENADRYL) 25 mg capsule Take 25 mg by mouth every 6 (six) hours as needed. For itching   Yes [provider]  TIZANIDINE HCL PO Take by mouth.   Yes [provider]  venlafaxine XR (EFFEXOR-XR) 150 MG 24 hr capsule Take 150 mg by mouth daily with breakfast.   Yes [provider]  budesonide (PULMICORT) 0.5 MG/2ML nebulizer solution Take 0.5 mg by nebulization 2 (two) times daily.    [provider]  fluticasone (FLOVENT HFA) 220 MCG/ACT inhaler Take 2 puffs by mouth 2 (two) times daily. Pt swallows medication rather than inhale    [provider]  Melatonin 10 MG CAPS Take 1 capsule by mouth at bedtime as needed. Sleep    [provider]  ondansetron (ZOFRAN ODT) 4 MG disintegrating tablet Take 1 tablet (4 mg total) by mouth every 8 (eight) hours as needed for nausea or vomiting. 09/10/17   Pricilla Loveless, MD    Family History Family History  Problem Relation Age of Onset  . Anxiety disorder Mother   .  Anxiety disorder Father   . Depression Father   . Cancer Maternal Grandfather        lukemia  . Cancer Paternal Grandfather        pancreatic    Social History Social History   Tobacco Use  . Smoking status: Former Smoker    Last attempt to quit: 02/29/2012    Years since quitting: 5.6  . Smokeless tobacco: Never Used  Substance Use Topics  . Alcohol use: Yes    Alcohol/week: 0.6 oz    Types: 1 Cans of beer per week    Comment: Has stopped because of current complaint.  . Drug use: Yes    Types:  Marijuana    Comment: marijuana occasional     Allergies   Patient has no known allergies.   Review of Systems Review of Systems  Constitutional: Negative for fever.  HENT: Negative for sore throat.   Eyes: Negative for redness.  Respiratory: Negative for shortness of breath.   Cardiovascular: Negative for chest pain.  Gastrointestinal: Positive for abdominal pain.  Genitourinary: Negative for flank pain.  Musculoskeletal: Negative for back pain and neck pain.  Skin: Negative for rash.  Neurological: Negative for headaches.  Hematological: Does not bruise/bleed easily.  Psychiatric/Behavioral: Negative for confusion.     Physical Exam Updated Vital Signs BP 113/81 (BP Location: Left Arm)   Pulse (!) 102   Temp 98.8 F (37.1 C) (Oral)   Resp 20   Ht 1.854 m (6\' 1" )   Wt 86.2 kg (190 lb)   SpO2 98%   BMI 25.07 kg/m   Physical Exam  Constitutional: He appears well-developed and well-nourished. No distress.  HENT:  Mouth/Throat: Oropharynx is clear and moist.  Eyes: Conjunctivae are normal. No scleral icterus.  Neck: Neck supple. No tracheal deviation present.  Cardiovascular: Normal rate, regular rhythm, normal heart sounds and intact distal pulses.  Pulmonary/Chest: Effort normal and breath sounds normal. No accessory muscle usage. No respiratory distress.  Abdominal: Soft. Bowel sounds are normal. He exhibits no distension and no mass. There is tenderness. There is no rebound and no guarding. No hernia.  Mild diffuse tenderness.   Genitourinary:  Genitourinary Comments: No cva tenderness  Musculoskeletal: He exhibits no edema.  Neurological: He is alert.  Skin: Skin is warm and dry. He is not diaphoretic.  Psychiatric: He has a normal mood and affect.  Nursing note and vitals reviewed.    ED Treatments / Results  Labs (all labs ordered are listed, but only abnormal results are displayed) Results for orders placed or performed during the hospital encounter  of 10/30/17  CBC  Result Value Ref Range   WBC 5.1 4.0 - 10.5 K/uL   RBC 4.63 4.22 - 5.81 MIL/uL   Hemoglobin 14.8 13.0 - 17.0 g/dL   HCT 16.142.3 09.639.0 - 04.552.0 %   MCV 91.4 78.0 - 100.0 fL   MCH 32.0 26.0 - 34.0 pg   MCHC 35.0 30.0 - 36.0 g/dL   RDW 40.913.0 81.111.5 - 91.415.5 %   Platelets 345 150 - 400 K/uL  Comprehensive metabolic panel  Result Value Ref Range   Sodium 135 135 - 145 mmol/L   Potassium 4.0 3.5 - 5.1 mmol/L   Chloride 103 101 - 111 mmol/L   CO2 25 22 - 32 mmol/L   Glucose, Bld 101 (H) 65 - 99 mg/dL   BUN 10 6 - 20 mg/dL   Creatinine, Ser 7.821.20 0.61 - 1.24 mg/dL   Calcium 9.2 8.9 -  10.3 mg/dL   Total Protein 7.6 6.5 - 8.1 g/dL   Albumin 4.7 3.5 - 5.0 g/dL   AST 30 15 - 41 U/L   ALT 35 17 - 63 U/L   Alkaline Phosphatase 59 38 - 126 U/L   Total Bilirubin 0.8 0.3 - 1.2 mg/dL   GFR calc non Af Amer >60 >60 mL/min   GFR calc Af Amer >60 >60 mL/min   Anion gap 7 5 - 15    EKG  EKG Interpretation None       Radiology No results found.  Procedures Procedures (including critical care time)  Medications Ordered in ED Medications  iopamidol (ISOVUE-300) 61 % injection 100 mL (100 mLs Intravenous Contrast Given 10/30/17 1430)     Initial Impression / Assessment and Plan / ED Course  I have reviewed the triage vital signs and the nursing notes.  Pertinent labs & imaging results that were available during my care of the patient were reviewed by me and considered in my medical decision making (see chart for details).  Iv ns. Labs.  CT.   Reviewed nursing notes and prior charts for additional history.   Ct neg acute.  Discussed results w pt.  Will have f/u with his pcp/gi doctor in the next 1-2 weeks.  Will recommend stool softener and miralax prn for constipation.    Final Clinical Impressions(s) / ED Diagnoses   Final diagnoses:  None    ED Discharge Orders    None       Cathren LaineSteinl, Floriene Jeschke, MD 10/30/17 [phone]

## 2017-11-19 DIAGNOSIS — R102 Pelvic and perineal pain: Secondary | ICD-10-CM | POA: Diagnosis not present

## 2017-11-19 DIAGNOSIS — R3912 Poor urinary stream: Secondary | ICD-10-CM | POA: Diagnosis not present

## 2018-06-15 DIAGNOSIS — K200 Eosinophilic esophagitis: Secondary | ICD-10-CM | POA: Diagnosis not present

## 2018-06-15 DIAGNOSIS — R0789 Other chest pain: Secondary | ICD-10-CM | POA: Diagnosis not present

## 2018-06-15 DIAGNOSIS — Z6826 Body mass index (BMI) 26.0-26.9, adult: Secondary | ICD-10-CM | POA: Diagnosis not present

## 2019-04-27 DIAGNOSIS — H2000 Unspecified acute and subacute iridocyclitis: Secondary | ICD-10-CM | POA: Diagnosis not present

## 2019-05-04 DIAGNOSIS — H2000 Unspecified acute and subacute iridocyclitis: Secondary | ICD-10-CM | POA: Diagnosis not present

## 2019-05-31 DIAGNOSIS — D2272 Melanocytic nevi of left lower limb, including hip: Secondary | ICD-10-CM | POA: Diagnosis not present

## 2019-05-31 DIAGNOSIS — D224 Melanocytic nevi of scalp and neck: Secondary | ICD-10-CM | POA: Diagnosis not present

## 2019-05-31 DIAGNOSIS — L7211 Pilar cyst: Secondary | ICD-10-CM | POA: Diagnosis not present

## 2019-06-14 IMAGING — CT CT ABD-PELV W/ CM
2 of 4 series · 16 of 46 positions shown, 18 images · IV contrast (iopamidol)
Comparison: 07/16/2012

CLINICAL DATA: No bowel movement for 1 week.

EXAM:
CT ABDOMEN AND PELVIS WITH CONTRAST
TECHNIQUE: Multidetector CT imaging of the abdomen and pelvis was performed
using the standard protocol following bolus administration of
intravenous contrast.
CONTRAST:  100mL OQTI3Y-G77 IOPAMIDOL (OQTI3Y-G77) INJECTION 61%

[Series 2: axial st · axial · 0.71mm/px · z∈[-552,-102]mm · 13 of 98 slices shown, 15 images]
[im 4/98  soft-tissue]
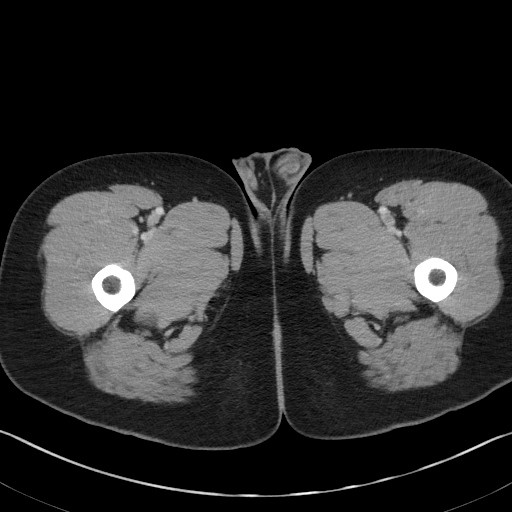
[im 4/98  bone]
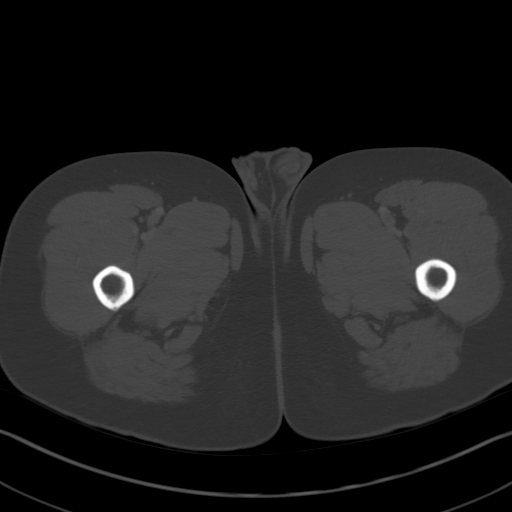
[im 12/98  soft-tissue]
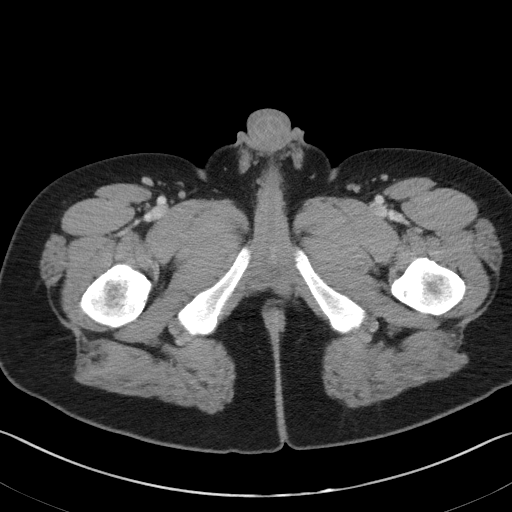
[im 20/98  soft-tissue]
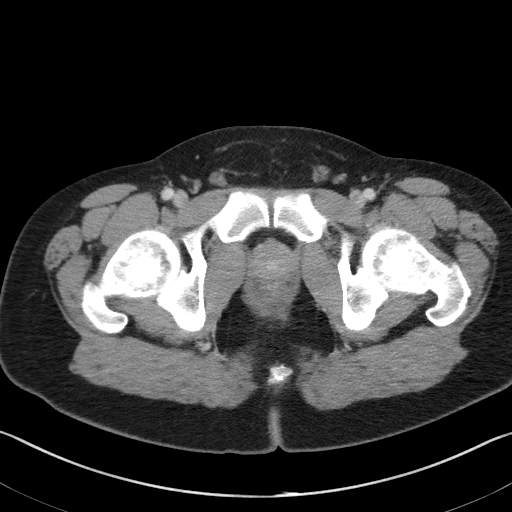
[im 28/98  soft-tissue]
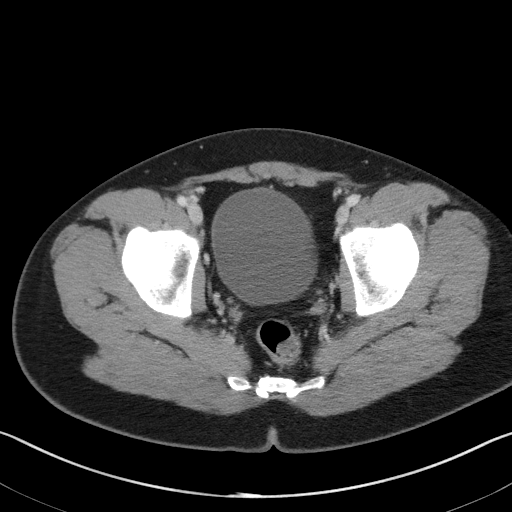
[im 35/98  soft-tissue]
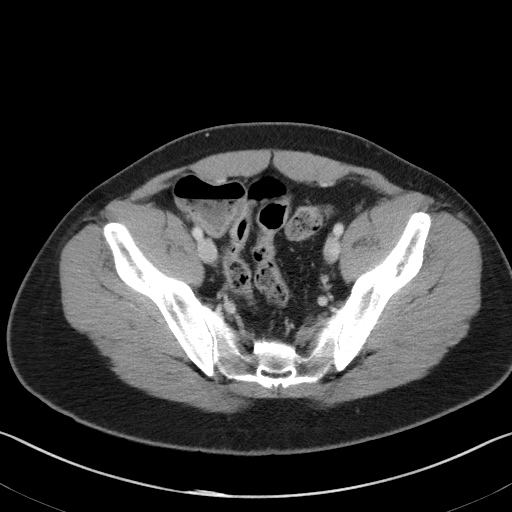
[im 43/98  soft-tissue]
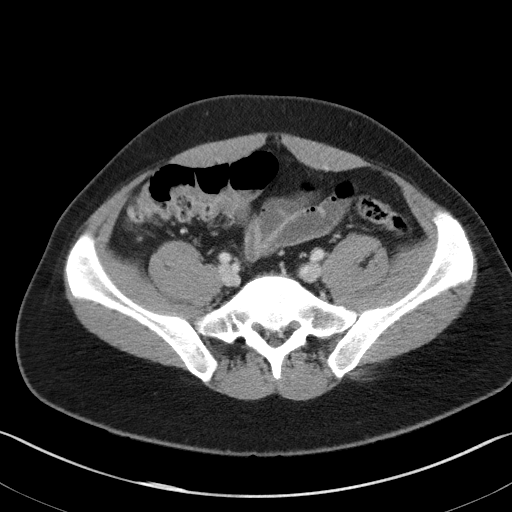
[im 51/98  soft-tissue]
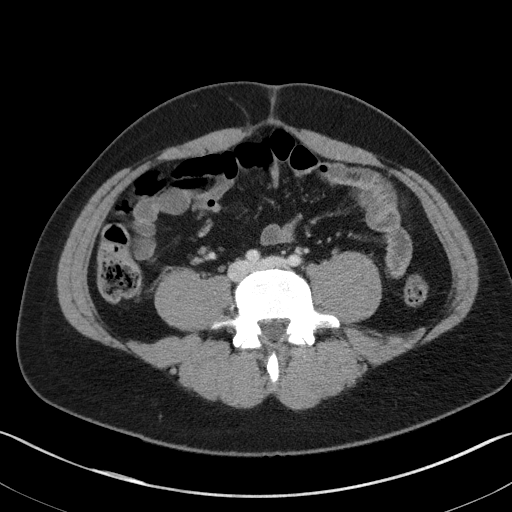
[im 55/98  soft-tissue]
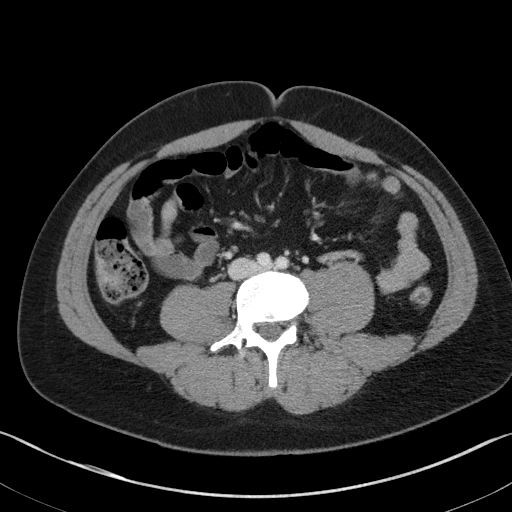
[im 63/98  soft-tissue]
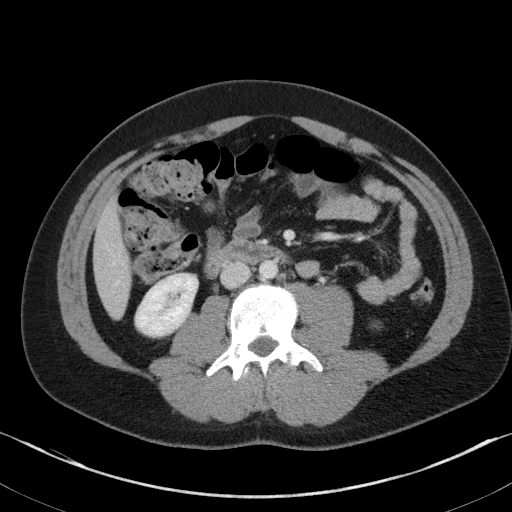
[im 63/98  bone]
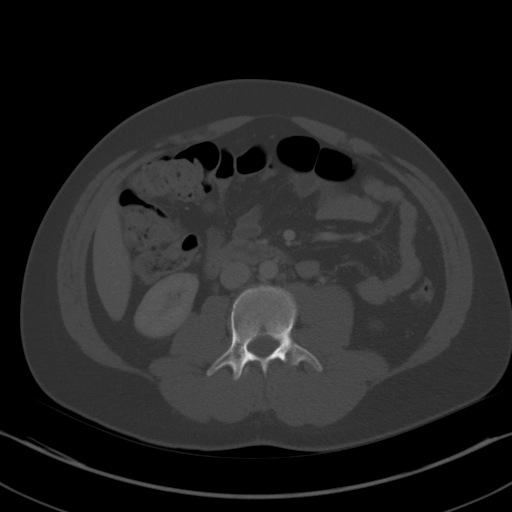
[im 70/98  soft-tissue]
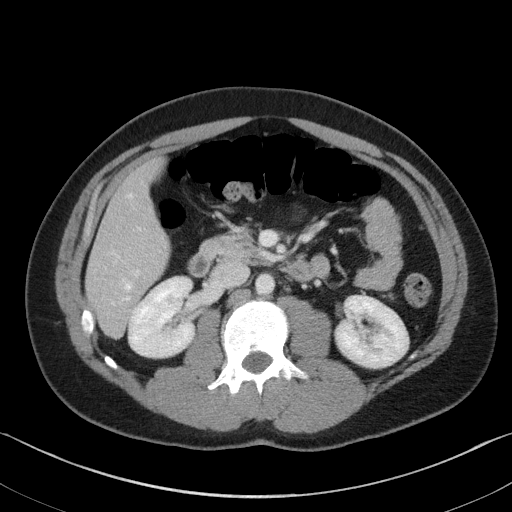
[im 78/98  soft-tissue]
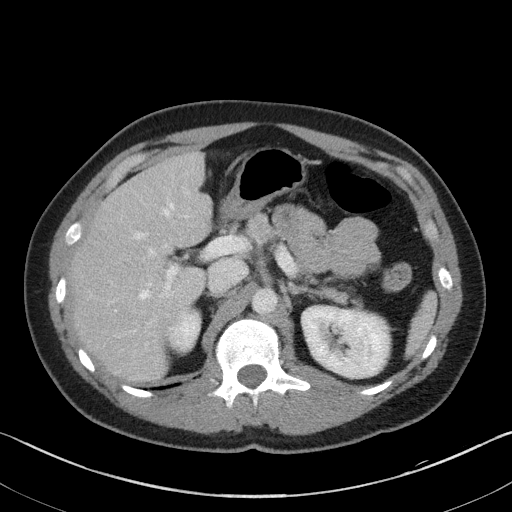
[im 86/98  soft-tissue]
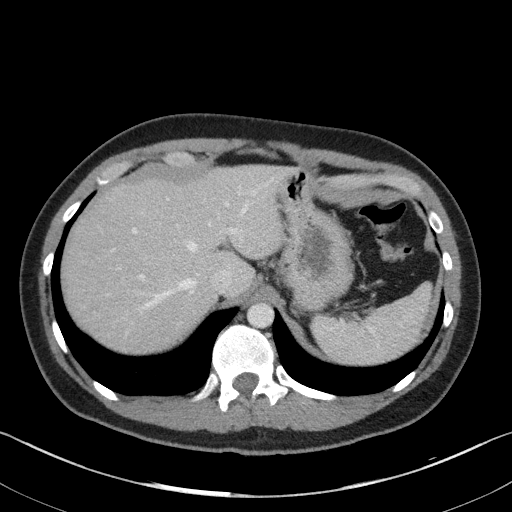
[im 94/98  soft-tissue]
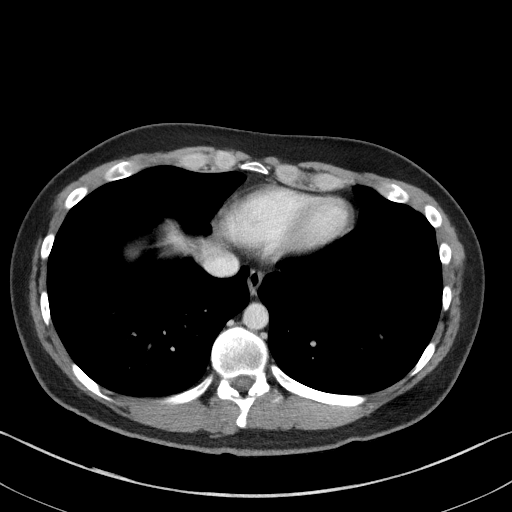

[Series 5: coronal st · coronal · 0.83mm/px · 3 of 86 slices shown]
[im 29/86  soft-tissue]
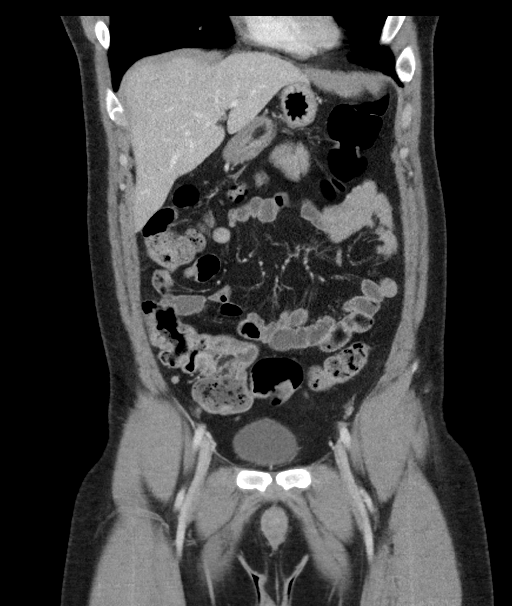
[im 38/86  soft-tissue]
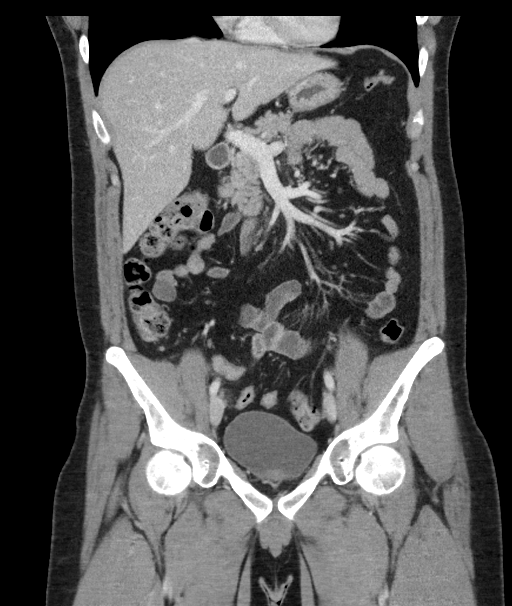
[im 48/86  soft-tissue]
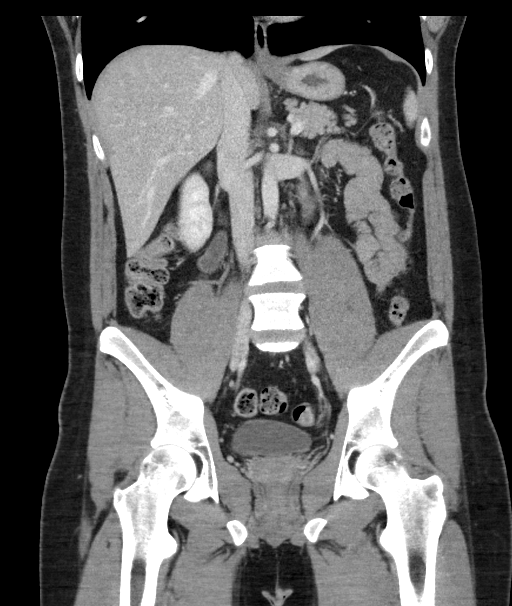

[16 of 46 positions shown; findings below may reference images not displayed]

FINDINGS: Lower chest:  Unremarkable.

Hepatobiliary: No focal abnormality within the liver parenchyma.
Gallbladder surgically absent. No intrahepatic or extrahepatic
biliary dilation.

Pancreas: No focal mass lesion. No dilatation of the main duct. No
intraparenchymal cyst. No peripancreatic edema.

Spleen: No splenomegaly. No focal mass lesion.

Adrenals/Urinary Tract: No adrenal nodule or mass. Kidneys are
unremarkable. No evidence for hydroureter. The urinary bladder
appears normal for the degree of distention.

Stomach/Bowel: Stomach is nondistended. No gastric wall thickening.
No evidence of outlet obstruction. Duodenum is normally positioned
as is the ligament of Treitz. No small bowel wall thickening. No
small bowel dilatation. The terminal ileum is normal. The appendix
is normal. No gross colonic mass. No colonic wall thickening. No
substantial diverticular change. Mild stool volume.

Vascular/Lymphatic: No abdominal aortic aneurysm. No abdominal
aortic atherosclerotic calcification. There is no gastrohepatic or
hepatoduodenal ligament lymphadenopathy. No intraperitoneal or
retroperitoneal lymphadenopathy. No pelvic sidewall lymphadenopathy.

Reproductive: The prostate gland and seminal vesicles have normal
imaging features.

Other: No intraperitoneal free fluid.

Musculoskeletal: Bone windows reveal no worrisome lytic or sclerotic
osseous lesions.
IMPRESSION: 1. No acute findings.
2. Low average to average stool volume in the colon. No CT features
that would suggest clinical constipation.

## 2019-08-09 DIAGNOSIS — K200 Eosinophilic esophagitis: Secondary | ICD-10-CM | POA: Diagnosis not present

## 2019-08-09 DIAGNOSIS — Z7951 Long term (current) use of inhaled steroids: Secondary | ICD-10-CM | POA: Diagnosis not present

## 2019-08-09 DIAGNOSIS — Z79899 Other long term (current) drug therapy: Secondary | ICD-10-CM | POA: Diagnosis not present

## 2019-08-09 DIAGNOSIS — R079 Chest pain, unspecified: Secondary | ICD-10-CM | POA: Diagnosis not present

## 2020-03-22 ENCOUNTER — Ambulatory Visit: Payer: Self-pay | Attending: Internal Medicine

## 2020-03-22 ENCOUNTER — Other Ambulatory Visit: Payer: Self-pay

## 2020-03-22 DIAGNOSIS — Z23 Encounter for immunization: Secondary | ICD-10-CM

## 2020-03-22 NOTE — Progress Notes (Signed)
   Covid-19 Vaccination Clinic  Name:  Terry Moreno    MRN: [id number] DOB: [birth year]  03/22/2020  Mr. [name] was observed post Covid-19 immunization for 15 minutes without incident. He was provided with Vaccine Information Sheet and instruction to access the V-Safe system.   Mr. [name] was instructed to call 911 with any severe reactions post vaccine: Marland Kitchen Difficulty breathing  . Swelling of face and throat  . A fast heartbeat  . A bad rash all over body  . Dizziness and weakness   Immunizations Administered    Name Date Dose VIS Date Route   Pfizer COVID-19 Vaccine 03/22/2020  8:30 AM 0.3 mL 01/24/2019 Intramuscular   Manufacturer: [employer], [employer]   Lot: [id number]   NDC: [address]-2

## 2020-04-13 ENCOUNTER — Other Ambulatory Visit: Payer: Self-pay

## 2020-04-13 ENCOUNTER — Ambulatory Visit: Payer: BC Managed Care – PPO | Attending: Internal Medicine

## 2020-04-13 DIAGNOSIS — Z23 Encounter for immunization: Secondary | ICD-10-CM

## 2020-04-13 NOTE — Progress Notes (Signed)
   [facility] Vaccination [facility]  Name:  Terry Moreno    MRN: [id number] DOB: 1991/03/05  04/13/2020  Mr. Tejada was observed post Covid-19 immunization for 15 minutes without incident. He was provided with Vaccine Information Sheet and instruction to access the V-Safe system.   Mr. Flight was instructed to call 911 with any severe reactions post vaccine: Marland Kitchen Difficulty breathing  . Swelling of face and throat  . A fast heartbeat  . A bad rash all over body  . Dizziness and weakness   Immunizations Administered    Name Date Dose VIS Date Route   Pfizer COVID-19 Vaccine 04/13/2020  8:53 AM 0.3 mL 01/24/2019 Intramuscular   Manufacturer: [employer], [employer]   Lot: [id number]   NDC: [address]-4

## 2021-05-22 DIAGNOSIS — L7211 Pilar cyst: Secondary | ICD-10-CM | POA: Diagnosis not present

## 2021-06-12 DIAGNOSIS — L7211 Pilar cyst: Secondary | ICD-10-CM | POA: Diagnosis not present

## 2021-06-13 DIAGNOSIS — K649 Unspecified hemorrhoids: Secondary | ICD-10-CM | POA: Diagnosis not present

## 2021-06-20 DIAGNOSIS — Z23 Encounter for immunization: Secondary | ICD-10-CM | POA: Diagnosis not present

## 2021-06-20 DIAGNOSIS — Z0000 Encounter for general adult medical examination without abnormal findings: Secondary | ICD-10-CM | POA: Diagnosis not present

## 2021-06-20 DIAGNOSIS — Z131 Encounter for screening for diabetes mellitus: Secondary | ICD-10-CM | POA: Diagnosis not present

## 2021-08-20 DIAGNOSIS — Z6823 Body mass index (BMI) 23.0-23.9, adult: Secondary | ICD-10-CM | POA: Diagnosis not present

## 2021-08-20 DIAGNOSIS — R1310 Dysphagia, unspecified: Secondary | ICD-10-CM | POA: Diagnosis not present

## 2021-08-20 DIAGNOSIS — K200 Eosinophilic esophagitis: Secondary | ICD-10-CM | POA: Diagnosis not present

## 2021-11-06 DIAGNOSIS — K2289 Other specified disease of esophagus: Secondary | ICD-10-CM | POA: Diagnosis not present

## 2021-11-06 DIAGNOSIS — K200 Eosinophilic esophagitis: Secondary | ICD-10-CM | POA: Diagnosis not present

## 2021-11-06 DIAGNOSIS — R1310 Dysphagia, unspecified: Secondary | ICD-10-CM | POA: Diagnosis not present

## 2021-11-06 DIAGNOSIS — R079 Chest pain, unspecified: Secondary | ICD-10-CM | POA: Diagnosis not present

## 2021-11-06 DIAGNOSIS — K219 Gastro-esophageal reflux disease without esophagitis: Secondary | ICD-10-CM | POA: Diagnosis not present

## 2021-11-06 DIAGNOSIS — R0789 Other chest pain: Secondary | ICD-10-CM | POA: Diagnosis not present

## 2021-11-09 DIAGNOSIS — S81812A Laceration without foreign body, left lower leg, initial encounter: Secondary | ICD-10-CM | POA: Diagnosis not present

## 2022-01-16 DIAGNOSIS — H31002 Unspecified chorioretinal scars, left eye: Secondary | ICD-10-CM | POA: Diagnosis not present

## 2022-01-16 DIAGNOSIS — H52203 Unspecified astigmatism, bilateral: Secondary | ICD-10-CM | POA: Diagnosis not present

## 2022-01-16 DIAGNOSIS — H5213 Myopia, bilateral: Secondary | ICD-10-CM | POA: Diagnosis not present
